# Patient Record
Sex: Female | Born: 1980 | Race: White | Hispanic: No | Marital: Married | Smoking: Never smoker
Health system: Southern US, Community
[De-identification: ages and names within clinical notes are randomized; demographics above are authoritative.]

## PROBLEM LIST (undated history)

## (undated) ENCOUNTER — Inpatient Hospital Stay (HOSPITAL_COMMUNITY): Payer: Self-pay

## (undated) DIAGNOSIS — D649 Anemia, unspecified: Secondary | ICD-10-CM

## (undated) HISTORY — PX: NASAL RECONSTRUCTION WITH SEPTAL REPAIR: SHX5665

## (undated) HISTORY — PX: WISDOM TOOTH EXTRACTION: SHX21

---

## 2012-10-12 ENCOUNTER — Other Ambulatory Visit: Payer: Self-pay | Admitting: Obstetrics & Gynecology

## 2012-10-12 DIAGNOSIS — N644 Mastodynia: Secondary | ICD-10-CM

## 2012-10-12 DIAGNOSIS — N63 Unspecified lump in unspecified breast: Secondary | ICD-10-CM

## 2012-10-20 ENCOUNTER — Ambulatory Visit
Admission: RE | Admit: 2012-10-20 | Discharge: 2012-10-20 | Disposition: A | Discharge status: Home / Self Care | Payer: BC Managed Care – PPO | Source: Ambulatory Visit | Attending: Obstetrics & Gynecology | Admitting: Obstetrics & Gynecology

## 2012-10-20 DIAGNOSIS — N644 Mastodynia: Secondary | ICD-10-CM

## 2012-10-20 DIAGNOSIS — N63 Unspecified lump in unspecified breast: Secondary | ICD-10-CM

## 2016-07-02 DIAGNOSIS — Z8249 Family history of ischemic heart disease and other diseases of the circulatory system: Secondary | ICD-10-CM | POA: Diagnosis not present

## 2016-07-02 DIAGNOSIS — Z6821 Body mass index (BMI) 21.0-21.9, adult: Secondary | ICD-10-CM | POA: Diagnosis not present

## 2016-07-02 DIAGNOSIS — Z0000 Encounter for general adult medical examination without abnormal findings: Secondary | ICD-10-CM | POA: Diagnosis not present

## 2016-07-24 DIAGNOSIS — Z8249 Family history of ischemic heart disease and other diseases of the circulatory system: Secondary | ICD-10-CM | POA: Diagnosis not present

## 2016-07-24 DIAGNOSIS — Z136 Encounter for screening for cardiovascular disorders: Secondary | ICD-10-CM | POA: Diagnosis not present

## 2016-08-11 DIAGNOSIS — Z3201 Encounter for pregnancy test, result positive: Secondary | ICD-10-CM | POA: Diagnosis not present

## 2016-08-13 DIAGNOSIS — Z8249 Family history of ischemic heart disease and other diseases of the circulatory system: Secondary | ICD-10-CM | POA: Diagnosis not present

## 2016-08-27 DIAGNOSIS — Z3201 Encounter for pregnancy test, result positive: Secondary | ICD-10-CM | POA: Diagnosis not present

## 2016-09-16 DIAGNOSIS — Z3689 Encounter for other specified antenatal screening: Secondary | ICD-10-CM | POA: Diagnosis not present

## 2016-09-16 DIAGNOSIS — Z113 Encounter for screening for infections with a predominantly sexual mode of transmission: Secondary | ICD-10-CM | POA: Diagnosis not present

## 2016-09-16 DIAGNOSIS — O09511 Supervision of elderly primigravida, first trimester: Secondary | ICD-10-CM | POA: Diagnosis not present

## 2016-09-16 LAB — OB RESULTS CONSOLE RPR: RPR: NONREACTIVE

## 2016-09-16 LAB — OB RESULTS CONSOLE HEPATITIS B SURFACE ANTIGEN: Hepatitis B Surface Ag: NEGATIVE

## 2016-09-16 LAB — OB RESULTS CONSOLE HIV ANTIBODY (ROUTINE TESTING): HIV: NONREACTIVE

## 2016-09-16 LAB — OB RESULTS CONSOLE RUBELLA ANTIBODY, IGM: Rubella: NON-IMMUNE/NOT IMMUNE

## 2016-09-17 DIAGNOSIS — R0600 Dyspnea, unspecified: Secondary | ICD-10-CM | POA: Diagnosis not present

## 2016-09-17 DIAGNOSIS — Z0389 Encounter for observation for other suspected diseases and conditions ruled out: Secondary | ICD-10-CM | POA: Diagnosis not present

## 2016-09-17 DIAGNOSIS — R002 Palpitations: Secondary | ICD-10-CM | POA: Diagnosis not present

## 2016-09-28 NOTE — L&D Delivery Note (Signed)
Delivery Note At 11:32 AM a viable and healthy female was delivered via Vaginal, Spontaneous Delivery (Presentation: LOA  ).  APGAR: 9, 9; weight  pending.   Placenta status spontaneous ,intact .  Cord:  with the following complications: none .  Cord pH: na  Anesthesia:epidural   Episiotomy: None Lacerations: 2nd degree Suture Repair: 2.0 3.0 vicryl rapide Est. Blood Loss (mL): 300  Mom to postpartum.  Baby to Couplet care / Skin to Skin.  Lundon Verdejo J 04/19/2017, 12:24 PM

## 2016-10-05 DIAGNOSIS — Z23 Encounter for immunization: Secondary | ICD-10-CM | POA: Diagnosis not present

## 2016-10-05 DIAGNOSIS — Z3491 Encounter for supervision of normal pregnancy, unspecified, first trimester: Secondary | ICD-10-CM | POA: Diagnosis not present

## 2016-10-05 DIAGNOSIS — Z3682 Encounter for antenatal screening for nuchal translucency: Secondary | ICD-10-CM | POA: Diagnosis not present

## 2016-10-05 DIAGNOSIS — Z36 Encounter for antenatal screening for chromosomal anomalies: Secondary | ICD-10-CM | POA: Diagnosis not present

## 2016-10-30 DIAGNOSIS — Z361 Encounter for antenatal screening for raised alphafetoprotein level: Secondary | ICD-10-CM | POA: Diagnosis not present

## 2016-11-20 DIAGNOSIS — Z363 Encounter for antenatal screening for malformations: Secondary | ICD-10-CM | POA: Diagnosis not present

## 2016-12-04 DIAGNOSIS — H35413 Lattice degeneration of retina, bilateral: Secondary | ICD-10-CM | POA: Diagnosis not present

## 2016-12-29 DIAGNOSIS — O43192 Other malformation of placenta, second trimester: Secondary | ICD-10-CM | POA: Diagnosis not present

## 2016-12-29 DIAGNOSIS — O09512 Supervision of elderly primigravida, second trimester: Secondary | ICD-10-CM | POA: Diagnosis not present

## 2016-12-29 DIAGNOSIS — Z3A24 24 weeks gestation of pregnancy: Secondary | ICD-10-CM | POA: Diagnosis not present

## 2017-01-25 DIAGNOSIS — Z23 Encounter for immunization: Secondary | ICD-10-CM | POA: Diagnosis not present

## 2017-01-25 DIAGNOSIS — O09513 Supervision of elderly primigravida, third trimester: Secondary | ICD-10-CM | POA: Diagnosis not present

## 2017-01-25 DIAGNOSIS — O43193 Other malformation of placenta, third trimester: Secondary | ICD-10-CM | POA: Diagnosis not present

## 2017-01-25 DIAGNOSIS — Z3689 Encounter for other specified antenatal screening: Secondary | ICD-10-CM | POA: Diagnosis not present

## 2017-01-25 DIAGNOSIS — Z3A28 28 weeks gestation of pregnancy: Secondary | ICD-10-CM | POA: Diagnosis not present

## 2017-02-08 DIAGNOSIS — O09513 Supervision of elderly primigravida, third trimester: Secondary | ICD-10-CM | POA: Diagnosis not present

## 2017-02-08 DIAGNOSIS — Z3A30 30 weeks gestation of pregnancy: Secondary | ICD-10-CM | POA: Diagnosis not present

## 2017-02-23 DIAGNOSIS — Z3A32 32 weeks gestation of pregnancy: Secondary | ICD-10-CM | POA: Diagnosis not present

## 2017-02-23 DIAGNOSIS — O09513 Supervision of elderly primigravida, third trimester: Secondary | ICD-10-CM | POA: Diagnosis not present

## 2017-03-03 ENCOUNTER — Inpatient Hospital Stay (HOSPITAL_COMMUNITY): Payer: BLUE CROSS/BLUE SHIELD

## 2017-03-03 ENCOUNTER — Inpatient Hospital Stay (HOSPITAL_COMMUNITY)
Admission: AD | Admit: 2017-03-03 | Discharge: 2017-03-03 | Disposition: A | Discharge status: Home / Self Care | Payer: BLUE CROSS/BLUE SHIELD | Source: Ambulatory Visit | Attending: Obstetrics and Gynecology | Admitting: Obstetrics and Gynecology

## 2017-03-03 ENCOUNTER — Encounter (HOSPITAL_COMMUNITY): Payer: Self-pay | Admitting: *Deleted

## 2017-03-03 DIAGNOSIS — O9A213 Injury, poisoning and certain other consequences of external causes complicating pregnancy, third trimester: Secondary | ICD-10-CM

## 2017-03-03 DIAGNOSIS — Z833 Family history of diabetes mellitus: Secondary | ICD-10-CM | POA: Diagnosis not present

## 2017-03-03 DIAGNOSIS — R109 Unspecified abdominal pain: Secondary | ICD-10-CM | POA: Insufficient documentation

## 2017-03-03 DIAGNOSIS — Z041 Encounter for examination and observation following transport accident: Secondary | ICD-10-CM | POA: Diagnosis not present

## 2017-03-03 DIAGNOSIS — Z8249 Family history of ischemic heart disease and other diseases of the circulatory system: Secondary | ICD-10-CM | POA: Diagnosis not present

## 2017-03-03 DIAGNOSIS — O09513 Supervision of elderly primigravida, third trimester: Secondary | ICD-10-CM | POA: Diagnosis not present

## 2017-03-03 DIAGNOSIS — Z3A33 33 weeks gestation of pregnancy: Secondary | ICD-10-CM

## 2017-03-03 DIAGNOSIS — O26893 Other specified pregnancy related conditions, third trimester: Secondary | ICD-10-CM | POA: Insufficient documentation

## 2017-03-03 HISTORY — DX: Anemia, unspecified: D64.9

## 2017-03-03 NOTE — MAU Note (Signed)
Urine in lab 
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
## 2017-03-09 DIAGNOSIS — O43193 Other malformation of placenta, third trimester: Secondary | ICD-10-CM | POA: Diagnosis not present

## 2017-03-09 DIAGNOSIS — Z3A34 34 weeks gestation of pregnancy: Secondary | ICD-10-CM | POA: Diagnosis not present

## 2017-03-22 DIAGNOSIS — Z3A36 36 weeks gestation of pregnancy: Secondary | ICD-10-CM | POA: Diagnosis not present

## 2017-03-22 DIAGNOSIS — Z3685 Encounter for antenatal screening for Streptococcus B: Secondary | ICD-10-CM | POA: Diagnosis not present

## 2017-03-22 DIAGNOSIS — O43199 Other malformation of placenta, unspecified trimester: Secondary | ICD-10-CM | POA: Diagnosis not present

## 2017-03-22 DIAGNOSIS — O43193 Other malformation of placenta, third trimester: Secondary | ICD-10-CM | POA: Diagnosis not present

## 2017-03-22 DIAGNOSIS — O09513 Supervision of elderly primigravida, third trimester: Secondary | ICD-10-CM | POA: Diagnosis not present

## 2017-03-22 LAB — OB RESULTS CONSOLE GBS: GBS: NEGATIVE

## 2017-03-26 DIAGNOSIS — Z3482 Encounter for supervision of other normal pregnancy, second trimester: Secondary | ICD-10-CM | POA: Diagnosis not present

## 2017-03-26 DIAGNOSIS — Z3483 Encounter for supervision of other normal pregnancy, third trimester: Secondary | ICD-10-CM | POA: Diagnosis not present

## 2017-03-29 DIAGNOSIS — O09513 Supervision of elderly primigravida, third trimester: Secondary | ICD-10-CM | POA: Diagnosis not present

## 2017-03-29 DIAGNOSIS — Z3A37 37 weeks gestation of pregnancy: Secondary | ICD-10-CM | POA: Diagnosis not present

## 2017-04-06 DIAGNOSIS — Z3A38 38 weeks gestation of pregnancy: Secondary | ICD-10-CM | POA: Diagnosis not present

## 2017-04-06 DIAGNOSIS — O09513 Supervision of elderly primigravida, third trimester: Secondary | ICD-10-CM | POA: Diagnosis not present

## 2017-04-12 DIAGNOSIS — O09513 Supervision of elderly primigravida, third trimester: Secondary | ICD-10-CM | POA: Diagnosis not present

## 2017-04-12 DIAGNOSIS — Z3A39 39 weeks gestation of pregnancy: Secondary | ICD-10-CM | POA: Diagnosis not present

## 2017-04-12 DIAGNOSIS — O43103 Malformation of placenta, unspecified, third trimester: Secondary | ICD-10-CM | POA: Diagnosis not present

## 2017-04-18 ENCOUNTER — Inpatient Hospital Stay (HOSPITAL_COMMUNITY)
Admission: AD | Admit: 2017-04-18 | Discharge: 2017-04-18 | Disposition: A | Discharge status: Home / Self Care | Payer: BLUE CROSS/BLUE SHIELD | Source: Ambulatory Visit | Attending: Obstetrics and Gynecology | Admitting: Obstetrics and Gynecology

## 2017-04-18 ENCOUNTER — Encounter (HOSPITAL_COMMUNITY): Payer: Self-pay | Admitting: Certified Nurse Midwife

## 2017-04-18 DIAGNOSIS — Z3A40 40 weeks gestation of pregnancy: Secondary | ICD-10-CM | POA: Diagnosis not present

## 2017-04-18 DIAGNOSIS — D62 Acute posthemorrhagic anemia: Secondary | ICD-10-CM | POA: Diagnosis not present

## 2017-04-18 DIAGNOSIS — O9081 Anemia of the puerperium: Secondary | ICD-10-CM | POA: Diagnosis not present

## 2017-04-18 DIAGNOSIS — Z23 Encounter for immunization: Secondary | ICD-10-CM | POA: Diagnosis not present

## 2017-04-18 DIAGNOSIS — Z412 Encounter for routine and ritual male circumcision: Secondary | ICD-10-CM | POA: Diagnosis not present

## 2017-04-18 DIAGNOSIS — Z3493 Encounter for supervision of normal pregnancy, unspecified, third trimester: Secondary | ICD-10-CM | POA: Diagnosis not present

## 2017-04-18 DIAGNOSIS — O479 False labor, unspecified: Secondary | ICD-10-CM

## 2017-04-18 NOTE — Discharge Instructions (Signed)

## 2017-04-18 NOTE — MAU Note (Signed)
I have communicated with Dr Ronita Hipps and reviewed vital signs:  Vitals:   04/18/17 2140 04/18/17 2324  BP: 132/90 125/79  Pulse: (!) 117 91  Resp: 20 16  Temp: 98.6 F (37 C) 98.4 F (36.9 C)    Vaginal exam:  Dilation: 1.5 Effacement (%): 90 Cervical Position: Middle Station: -2 Presentation: Vertex Exam by:: Wende Bushy RN ,   Also reviewed contraction pattern and that non-stress test is reactive.  It has been documented that patient is contracting every 3-5 minutes with minimal cervical change over 1.5 hours not indicating active labor.  Patient denies any other complaints.  Based on this report provider has given order for discharge.  A discharge order and diagnosis entered by a provider.   Labor discharge instructions reviewed with patient.

## 2017-04-18 NOTE — MAU Note (Signed)
Pt reports contractions every 5-6 mins since 7pm. Pt denies LOF or vaginal bleeding. Reports good fetal movement.

## 2017-04-18 NOTE — MAU Note (Signed)
Urine in lab 

## 2017-04-19 ENCOUNTER — Inpatient Hospital Stay (HOSPITAL_COMMUNITY)
Admission: AD | Admit: 2017-04-19 | Discharge: 2017-04-21 | DRG: 775 | Disposition: A | Discharge status: Home / Self Care | Payer: BLUE CROSS/BLUE SHIELD | Source: Ambulatory Visit | Attending: Obstetrics and Gynecology | Admitting: Obstetrics and Gynecology

## 2017-04-19 ENCOUNTER — Inpatient Hospital Stay (HOSPITAL_COMMUNITY): Payer: BLUE CROSS/BLUE SHIELD | Admitting: Anesthesiology

## 2017-04-19 ENCOUNTER — Encounter (HOSPITAL_COMMUNITY): Payer: Self-pay | Admitting: Certified Nurse Midwife

## 2017-04-19 DIAGNOSIS — O9081 Anemia of the puerperium: Principal | ICD-10-CM | POA: Diagnosis not present

## 2017-04-19 DIAGNOSIS — D62 Acute posthemorrhagic anemia: Secondary | ICD-10-CM | POA: Diagnosis not present

## 2017-04-19 DIAGNOSIS — Z412 Encounter for routine and ritual male circumcision: Secondary | ICD-10-CM | POA: Diagnosis not present

## 2017-04-19 DIAGNOSIS — Z3A40 40 weeks gestation of pregnancy: Secondary | ICD-10-CM

## 2017-04-19 DIAGNOSIS — Z3493 Encounter for supervision of normal pregnancy, unspecified, third trimester: Secondary | ICD-10-CM | POA: Diagnosis present

## 2017-04-19 LAB — TYPE AND SCREEN
ABO/RH(D): O POS
Antibody Screen: NEGATIVE

## 2017-04-19 LAB — CBC
HCT: 35.9 % — ABNORMAL LOW (ref 36.0–46.0)
Hemoglobin: 12.3 g/dL (ref 12.0–15.0)
MCH: 28.5 pg (ref 26.0–34.0)
MCHC: 34.3 g/dL (ref 30.0–36.0)
MCV: 83.3 fL (ref 78.0–100.0)
Platelets: 245 10*3/uL (ref 150–400)
RBC: 4.31 MIL/uL (ref 3.87–5.11)
RDW: 13.9 % (ref 11.5–15.5)
WBC: 14.2 10*3/uL — ABNORMAL HIGH (ref 4.0–10.5)

## 2017-04-19 LAB — ABO/RH: ABO/RH(D): O POS

## 2017-04-19 MED ORDER — DIPHENHYDRAMINE HCL 50 MG/ML IJ SOLN: 12.5000 mg | INTRAMUSCULAR | Status: DC | PRN

## 2017-04-19 MED ORDER — EPHEDRINE 5 MG/ML INJ
10.0000 mg | INTRAVENOUS | Status: DC | PRN
  Filled 2017-04-19: qty 2

## 2017-04-19 MED ORDER — BENZOCAINE-MENTHOL 20-0.5 % EX AERO
1.0000 "application " | INHALATION_SPRAY | CUTANEOUS | Status: DC | PRN
  Filled 2017-04-19: qty 56

## 2017-04-19 MED ORDER — COCONUT OIL OIL: 1.0000 "application " | TOPICAL_OIL | Status: DC | PRN

## 2017-04-19 MED ORDER — PHENYLEPHRINE 40 MCG/ML (10ML) SYRINGE FOR IV PUSH (FOR BLOOD PRESSURE SUPPORT): 80.0000 ug | PREFILLED_SYRINGE | INTRAVENOUS | Status: DC | PRN

## 2017-04-19 MED ORDER — ONDANSETRON HCL 4 MG PO TABS: 4.0000 mg | ORAL_TABLET | ORAL | Status: DC | PRN

## 2017-04-19 MED ORDER — SOD CITRATE-CITRIC ACID 500-334 MG/5ML PO SOLN: 30.0000 mL | ORAL | Status: DC | PRN

## 2017-04-19 MED ORDER — LIDOCAINE HCL (PF) 1 % IJ SOLN
INTRAMUSCULAR | Status: DC | PRN
  Administered 2017-04-19 (×2): 5 mL via EPIDURAL

## 2017-04-19 MED ORDER — PRENATAL MULTIVITAMIN CH
1.0000 | ORAL_TABLET | Freq: Every day | ORAL | Status: DC
  Administered 2017-04-20: 1 via ORAL
  Filled 2017-04-19: qty 1

## 2017-04-19 MED ORDER — OXYCODONE-ACETAMINOPHEN 5-325 MG PO TABS: 1.0000 | ORAL_TABLET | ORAL | Status: DC | PRN

## 2017-04-19 MED ORDER — FENTANYL 2.5 MCG/ML BUPIVACAINE 1/10 % EPIDURAL INFUSION (WH - ANES): 14.0000 mL/h | INTRAMUSCULAR | Status: DC | PRN

## 2017-04-19 MED ORDER — IBUPROFEN 600 MG PO TABS
600.0000 mg | ORAL_TABLET | Freq: Four times a day (QID) | ORAL | Status: DC
  Administered 2017-04-19 – 2017-04-20 (×6): 600 mg via ORAL
  Filled 2017-04-19 (×7): qty 1

## 2017-04-19 MED ORDER — EPHEDRINE 5 MG/ML INJ: 10.0000 mg | INTRAVENOUS | Status: DC | PRN

## 2017-04-19 MED ORDER — OXYCODONE-ACETAMINOPHEN 5-325 MG PO TABS: 2.0000 | ORAL_TABLET | ORAL | Status: DC | PRN

## 2017-04-19 MED ORDER — LACTATED RINGERS IV SOLN
500.0000 mL | INTRAVENOUS | Status: DC | PRN
  Administered 2017-04-19: 500 mL via INTRAVENOUS

## 2017-04-19 MED ORDER — DIBUCAINE 1 % RE OINT: 1.0000 "application " | TOPICAL_OINTMENT | RECTAL | Status: DC | PRN

## 2017-04-19 MED ORDER — LIDOCAINE HCL (PF) 1 % IJ SOLN
30.0000 mL | INTRAMUSCULAR | Status: DC | PRN
  Filled 2017-04-19: qty 30

## 2017-04-19 MED ORDER — OXYTOCIN 40 UNITS IN LACTATED RINGERS INFUSION - SIMPLE MED
2.5000 [IU]/h | INTRAVENOUS | Status: DC
  Administered 2017-04-19: 2.5 [IU]/h via INTRAVENOUS
  Filled 2017-04-19: qty 1000

## 2017-04-19 MED ORDER — LACTATED RINGERS IV SOLN: 500.0000 mL | Freq: Once | INTRAVENOUS | Status: DC

## 2017-04-19 MED ORDER — DIPHENHYDRAMINE HCL 25 MG PO CAPS: 25.0000 mg | ORAL_CAPSULE | Freq: Four times a day (QID) | ORAL | Status: DC | PRN

## 2017-04-19 MED ORDER — PHENYLEPHRINE 40 MCG/ML (10ML) SYRINGE FOR IV PUSH (FOR BLOOD PRESSURE SUPPORT)
80.0000 ug | PREFILLED_SYRINGE | INTRAVENOUS | Status: DC | PRN
  Filled 2017-04-19: qty 5

## 2017-04-19 MED ORDER — ONDANSETRON HCL 4 MG/2ML IJ SOLN: 4.0000 mg | Freq: Four times a day (QID) | INTRAMUSCULAR | Status: DC | PRN

## 2017-04-19 MED ORDER — LACTATED RINGERS IV SOLN: INTRAVENOUS | Status: DC

## 2017-04-19 MED ORDER — ACETAMINOPHEN 325 MG PO TABS
650.0000 mg | ORAL_TABLET | ORAL | Status: DC | PRN
Start: 2017-04-19 — End: 2017-04-21
  Administered 2017-04-20: 650 mg via ORAL
  Filled 2017-04-19: qty 2

## 2017-04-19 MED ORDER — SENNOSIDES-DOCUSATE SODIUM 8.6-50 MG PO TABS
2.0000 | ORAL_TABLET | ORAL | Status: DC
  Administered 2017-04-19 – 2017-04-20 (×2): 2 via ORAL
  Filled 2017-04-19 (×2): qty 2

## 2017-04-19 MED ORDER — SIMETHICONE 80 MG PO CHEW: 80.0000 mg | CHEWABLE_TABLET | ORAL | Status: DC | PRN

## 2017-04-19 MED ORDER — FLEET ENEMA 7-19 GM/118ML RE ENEM: 1.0000 | ENEMA | RECTAL | Status: DC | PRN

## 2017-04-19 MED ORDER — ONDANSETRON HCL 4 MG/2ML IJ SOLN: 4.0000 mg | INTRAMUSCULAR | Status: DC | PRN

## 2017-04-19 MED ORDER — ACETAMINOPHEN 325 MG PO TABS: 650.0000 mg | ORAL_TABLET | ORAL | Status: DC | PRN

## 2017-04-19 MED ORDER — PHENYLEPHRINE 40 MCG/ML (10ML) SYRINGE FOR IV PUSH (FOR BLOOD PRESSURE SUPPORT)
80.0000 ug | PREFILLED_SYRINGE | INTRAVENOUS | Status: DC | PRN
  Filled 2017-04-19: qty 10
  Filled 2017-04-19: qty 5

## 2017-04-19 MED ORDER — FENTANYL 2.5 MCG/ML BUPIVACAINE 1/10 % EPIDURAL INFUSION (WH - ANES)
14.0000 mL/h | INTRAMUSCULAR | Status: DC | PRN
  Administered 2017-04-19 (×2): 14 mL/h via EPIDURAL
  Filled 2017-04-19 (×2): qty 100

## 2017-04-19 MED ORDER — TETANUS-DIPHTH-ACELL PERTUSSIS 5-2.5-18.5 LF-MCG/0.5 IM SUSP
0.5000 mL | Freq: Once | INTRAMUSCULAR | Status: AC
  Administered 2017-04-20: 0.5 mL via INTRAMUSCULAR
  Filled 2017-04-19: qty 0.5

## 2017-04-19 MED ORDER — WITCH HAZEL-GLYCERIN EX PADS: 1.0000 "application " | MEDICATED_PAD | CUTANEOUS | Status: DC | PRN

## 2017-04-19 MED ORDER — LACTATED RINGERS IV SOLN
500.0000 mL | Freq: Once | INTRAVENOUS | Status: AC
  Administered 2017-04-19: 500 mL via INTRAVENOUS

## 2017-04-19 MED ORDER — METHYLERGONOVINE MALEATE 0.2 MG/ML IJ SOLN
0.2000 mg | INTRAMUSCULAR | Status: DC | PRN
Start: 2017-04-19 — End: 2017-04-21

## 2017-04-19 MED ORDER — METHYLERGONOVINE MALEATE 0.2 MG PO TABS
0.2000 mg | ORAL_TABLET | ORAL | Status: DC | PRN
Start: 2017-04-19 — End: 2017-04-21

## 2017-04-19 MED ORDER — ZOLPIDEM TARTRATE 5 MG PO TABS: 5.0000 mg | ORAL_TABLET | Freq: Every evening | ORAL | Status: DC | PRN

## 2017-04-19 MED ORDER — OXYTOCIN BOLUS FROM INFUSION
500.0000 mL | Freq: Once | INTRAVENOUS | Status: AC
  Administered 2017-04-19: 500 mL via INTRAVENOUS

## 2017-04-19 NOTE — H&P (Signed)
Jade Beck is a 36 y.o. female presenting for contractions. OB History    Gravida Para Term Preterm AB Living   1             SAB TAB Ectopic Multiple Live Births                 Past Medical History:  Diagnosis Date  . Anemia    pregnancy induced   Past Surgical History:  Procedure Laterality Date  . NASAL RECONSTRUCTION WITH SEPTAL REPAIR    . WISDOM TOOTH EXTRACTION     Family History: family history includes Diabetes in her father; Heart disease in her brother. Social History:  reports that she has never smoked. She has never used smokeless tobacco. She reports that she does not drink alcohol or use drugs.     Maternal Diabetes: No Genetic Screening: Normal Maternal Ultrasounds/Referrals: Normal Fetal Ultrasounds or other Referrals:  None Maternal Substance Abuse:  No Significant Maternal Medications:  None Significant Maternal Lab Results:  None Other Comments:  None  Review of Systems  Constitutional: Negative.   All other systems reviewed and are negative.  Maternal Medical History:  Reason for admission: Contractions.   Contractions: Onset was 3-5 hours ago.   Frequency: irregular.   Perceived severity is moderate.    Fetal activity: Perceived fetal activity is normal.   Last perceived fetal movement was within the past hour.    Prenatal complications: Placental abnormality.   Prenatal Complications - Diabetes: none.    Dilation: 5 Effacement (%): 90 Station: 0, +1 Exam by:: Lamonte Sakai, RNC Blood pressure (!) 101/43, pulse 73, temperature 98.1 F (36.7 C), temperature source Oral, resp. rate 18, height 5\' 9"  (1.753 m), weight 84.8 kg (187 lb), SpO2 100 %. Maternal Exam:  Uterine Assessment: Contraction strength is moderate.  Contraction frequency is irregular.   Abdomen: Patient reports no abdominal tenderness. Fetal presentation: vertex  Introitus: Normal vulva. Normal vagina.  Ferning test: not done.  Nitrazine test: not done. Amniotic fluid  character: not assessed.  Pelvis: adequate for delivery.   Cervix: Cervix evaluated by digital exam.     Physical Exam  Nursing note and vitals reviewed. Constitutional: She is oriented to person, place, and time. She appears well-developed and well-nourished.  HENT:  Head: Normocephalic and atraumatic.  Neck: Normal range of motion.  Cardiovascular: Normal rate and regular rhythm.   Respiratory: Effort normal and breath sounds normal.  GI: Bowel sounds are normal.  Genitourinary: Vagina normal and uterus normal.  Neurological: She is alert and oriented to person, place, and time. She has normal reflexes.  Skin: Skin is warm and dry.  Psychiatric: She has a normal mood and affect.    Prenatal labs: ABO, Rh: --/--/O POS, O POS (07/23 0151) Antibody: NEG (07/23 0151) Rubella:   RPR:    HBsAg:    HIV:    GBS:     Assessment/Plan: 40w IUP Early labor Marginal PCI Admit   Pharrah Rottman J 04/19/2017, 6:06 AM

## 2017-04-19 NOTE — Anesthesia Pain Management Evaluation Note (Signed)
  CRNA Pain Management Visit Note  Patient: Jade Beck, 36 y.o., female  "Hello I am a member of the anesthesia team at [facility]. We have an anesthesia team available at all times to provide care throughout the hospital, including epidural management and anesthesia for C-section. I don't know your plan for the delivery whether it a natural birth, water birth, IV sedation, nitrous supplementation, doula or epidural, but we want to meet your pain goals."   1.Was your pain managed to your expectations on prior hospitalizations?   Yes   2.What is your expectation for pain management during this hospitalization?     Epidural  3.How can we help you reach that goal? Pt comfortable with epidural  Record the patient's initial score and the patient's pain goal.   Pain: 0  Pain Goal: 5 [facility] wants you to be able to say your pain was always managed very well.  Brittinee Risk 04/19/2017

## 2017-04-19 NOTE — Anesthesia Preprocedure Evaluation (Signed)

## 2017-04-19 NOTE — Anesthesia Procedure Notes (Signed)
Epidural Patient location during procedure: OB Start time: 04/19/2017 2:50 AM End time: 04/19/2017 2:59 AM  Staffing Anesthesiologist: Lionel Woodberry  Preanesthetic Checklist Completed: patient identified, site marked, surgical consent, pre-op evaluation, timeout performed, IV checked, risks and benefits discussed and monitors and equipment checked  Epidural Patient position: sitting Prep: site prepped and draped and DuraPrep Patient monitoring: continuous pulse ox and blood pressure Approach: midline Location: L4-L5 Injection technique: LOR air  Needle:  Needle type: Tuohy  Needle gauge: 17 G Needle length: 9 cm and 9 Needle insertion depth: 5 cm cm Catheter type: closed end flexible Catheter size: 19 Gauge Catheter at skin depth: 10 cm Test dose: negative  Assessment Events: blood not aspirated, injection not painful, no injection resistance, negative IV test and no paresthesia

## 2017-04-19 NOTE — MAU Note (Signed)
Patient has returned to MAU for c/o stronger contractions. Patient 1.5cm on discharge at 2300.

## 2017-04-20 LAB — RPR: RPR Ser Ql: NONREACTIVE

## 2017-04-20 LAB — CBC
HCT: 26.6 % — ABNORMAL LOW (ref 36.0–46.0)
Hemoglobin: 9 g/dL — ABNORMAL LOW (ref 12.0–15.0)
MCH: 29.1 pg (ref 26.0–34.0)
MCHC: 33.8 g/dL (ref 30.0–36.0)
MCV: 86.1 fL (ref 78.0–100.0)
Platelets: 168 10*3/uL (ref 150–400)
RBC: 3.09 MIL/uL — ABNORMAL LOW (ref 3.87–5.11)
RDW: 14.5 % (ref 11.5–15.5)
WBC: 15.8 10*3/uL — ABNORMAL HIGH (ref 4.0–10.5)

## 2017-04-20 MED ORDER — MAGNESIUM OXIDE 400 (241.3 MG) MG PO TABS
400.0000 mg | ORAL_TABLET | Freq: Every day | ORAL | Status: DC
  Administered 2017-04-20: 400 mg via ORAL
  Filled 2017-04-20 (×2): qty 1

## 2017-04-20 MED ORDER — FERROUS SULFATE 325 (65 FE) MG PO TABS
325.0000 mg | ORAL_TABLET | Freq: Two times a day (BID) | ORAL | Status: DC
  Administered 2017-04-20: 325 mg via ORAL
  Filled 2017-04-20: qty 1

## 2017-04-20 MED ORDER — MEASLES, MUMPS & RUBELLA VAC ~~LOC~~ INJ
0.5000 mL | INJECTION | Freq: Once | SUBCUTANEOUS | Status: AC
  Administered 2017-04-20: 0.5 mL via SUBCUTANEOUS
  Filled 2017-04-20: qty 0.5

## 2017-04-20 NOTE — Lactation Note (Signed)
This note was copied from a baby's chart. Lactation Consultation Note  Patient Name: Jade Beck WFUXN'A Date: 04/20/2017 Reason for consult: Initial assessment Baby at 24 hr of life. Mom is concerned that baby is not "taking the whole nipple and he is using me as a pacifier". Discussed baby behavior, feeding frequency, positioning at the breast, baby belly size, voids, wt loss, breast changes, and nipple care. Demonstrated manual expression, colostrum noted bilaterally, spoon in room. Given lactation handouts. Aware of OP services and support group. Mom will offer the breast on demand 8+/24hr, post express, and spoon feed as needed. Lactation phone number is on the white board for her to call at the next feeding.     Maternal Data Has patient been taught Hand Expression?: Yes Does the patient have breastfeeding experience prior to this delivery?: No  Feeding Feeding Type: Breast Fed Length of feed: 30 min  LATCH Score/Interventions Latch: Too sleepy or reluctant, no latch achieved, no sucking elicited.                    Lactation Tools Discussed/Used WIC Program: No   Consult Status Consult Status: Follow-up Date: 04/21/17 Follow-up type: In-patient    Denzil Hughes 04/20/2017, 11:33 AM

## 2017-04-20 NOTE — Progress Notes (Signed)
PPD #1, SVD, 2nd degree perineal laceration, baby boy  S:  Reports feeling good, slept well last night   Some discomfort from repair with prolonged sitting or standing             Tolerating po/ No nausea or vomiting / Denies SOB / Had one episode of dizziness after delivery, but has resolved             Bleeding is moderate             Pain controlled with Motrin             Up ad lib / ambulatory / voiding QS  Newborn breast feeding - getting more comfortable with latch/positioning  / Circumcision - planning today by Dr. Ronita Hipps  O:               VS: BP (!) 100/47 (BP Location: Right Arm)   Pulse 93   Temp 98.7 F (37.1 C) (Oral)   Resp 16   Ht 5' 9"  (1.753 m)   Wt 84.8 kg (187 lb)   SpO2 100%   Breastfeeding? Unknown   BMI 27.62 kg/m    LABS:              Recent Labs  04/19/17 0151 04/20/17 0606  WBC 14.2* 15.8*  HGB 12.3 9.0*  PLT 245 168               Blood type: --/--/O POS, O POS (07/23 0151)  Rubella: Nonimmune (12/20 0000)                     I&O: Intake/Output      07/23 0701 - 07/24 0700 07/24 0701 - 07/25 0700   Urine (mL/kg/hr) 1400 (0.7)    Blood 300    Total Output 1700     Net -1700          Urine Occurrence 1 x                  Physical Exam:             Alert and oriented X3  Lungs: Clear and unlabored  Heart: regular rate and rhythm / no mumurs  Abdomen: soft, non-tender, non-distended              Fundus: firm, non-tender, U-1  Perineum: well approximated 2nd degree perineal laceration, no significant edema, no erythema, no evidence of hematoma  Lochia: appropriate, no clots  Extremities: no edema, no calf pain or tenderness    A: PPD # 1, SVD  2nd degree laceration  ABL anemia compounding maternal IDA  Rubella Non-immune   Doing well - stable status  P: Routine post partum orders  Pt. Requested to receive MMR and Tdap vaccine today - given by RN  Niferex 163m BID  Magnesium Oxide 4038mdaily  See lactation today  May ambulate in  halls  May shower today  Anticipate d/c home tomorrow   MeLars PinksMSN, CNM [facility]

## 2017-04-21 DIAGNOSIS — D62 Acute posthemorrhagic anemia: Secondary | ICD-10-CM | POA: Diagnosis not present

## 2017-04-21 MED ORDER — MAGNESIUM OXIDE 400 (241.3 MG) MG PO TABS: 400.0000 mg | ORAL_TABLET | Freq: Every day | ORAL | 0 refills | Status: DC

## 2017-04-21 MED ORDER — IBUPROFEN 600 MG PO TABS: 600.0000 mg | ORAL_TABLET | Freq: Four times a day (QID) | ORAL | 0 refills | Status: DC

## 2017-04-21 MED ORDER — FERROUS SULFATE 325 (65 FE) MG PO TABS: 325.0000 mg | ORAL_TABLET | Freq: Every day | ORAL | 0 refills | Status: DC

## 2017-04-21 NOTE — Lactation Note (Addendum)
This note was copied from a baby's chart. Lactation Consultation Note  Patient Name: Jade Beck JSEGB'T Date: 04/21/2017 Reason for consult: Follow-up assessment  Baby 57 hours old. Parents report concern that baby is not getting enough at the breast, has had low output (voids) and mom really wants to BF. Assisted mom to latch baby in football position--mom was using cradle position. Baby able to latch deeply and suckle rhythmically with a few swallows noted. Assisted mom with hand expression, and parents able to spoon-feed about 3 ml of EBM. Baby tolerated well, and mom's EBM volume increasing with hand expression. Mom given a manual pump with review, and a curve tipped syringe to supplement larger volumes as needed. Mom reports that she has a DEBP at home as well.  Enc mom to put baby to breast with cues, then supplement with EBM/formula according to guidelines--which were given with review. Discussed engorgement prevention/treatment. Mom aware of OP/BFSG and [place] phone line assistance after D/C.      Maternal Data    Feeding Feeding Type: Breast Fed Length of feed: 10 min  LATCH Score/Interventions Latch: Grasps breast easily, tongue down, lips flanged, rhythmical sucking. Intervention(s): Skin to skin;Teach feeding cues;Waking techniques Intervention(s): Adjust position;Assist with latch;Breast compression  Audible Swallowing: A few with stimulation Intervention(s): Skin to skin;Hand expression  Type of Nipple: Everted at rest and after stimulation  Comfort (Breast/Nipple): Soft / non-tender     Hold (Positioning): Assistance needed to correctly position infant at breast and maintain latch. Intervention(s): Breastfeeding basics reviewed;Support Pillows;Position options;Skin to skin  LATCH Score: 8  Lactation Tools Discussed/Used Tools: Pump Breast pump type: Manual Pump Review: Setup, frequency, and cleaning;Milk Storage Initiated by:: JW Date initiated::  04/21/17   Consult Status Consult Status: PRN    Andres Labrum 04/21/2017, 10:32 AM

## 2017-04-21 NOTE — Progress Notes (Signed)
PPD 2 SVD with 2nd degree  S:  Reports feeling ok - long nigt with clustering but doing well this am             Tolerating po/ No nausea or vomiting             Bleeding is light             Pain controlled with motrin             Up ad lib / ambulatory / voiding QS  Newborn breast feeding  / Circumcision done yesterday  O:   VS: BP 109/71   Pulse 93   Temp 97.9 F (36.6 C) (Oral)   Resp 18   Ht 5\' 9"  (1.753 m)   Wt 84.8 kg (187 lb)   SpO2 100%   Breastfeeding? Unknown   BMI 27.62 kg/m    LABS:              Recent Labs  04/19/17 0151 04/20/17 0606  WBC 14.2* 15.8*  HGB 12.3 9.0*  PLT 245 168               Blood type: --/--/O POS, O POS (07/23 0151)  Rubella: Nonimmune (12/20 0000)  - booster given                           Physical Exam:             Alert and oriented X3  Abdomen: soft, non-tender, non-distended              Fundus: firm, non-tender, U-1  Perineum: no edema  Lochia: light  Extremities: no edema, no calf pain or tenderness  A: PPD # 2   Doing well - stable status  P: Routine post partum orders  DC home - WOB booklet - instructions reviewed  Artelia Laroche CNM, MSN, [place] 04/21/2017, 9:03 AM

## 2017-04-21 NOTE — Discharge Summary (Signed)
Obstetric Discharge Summary Reason for Admission: onset of labor Prenatal Procedures: none Intrapartum Procedures: spontaneous vaginal delivery and epidural Postpartum Procedures: Rubella Ig and Tdap given Complications-Operative and Postpartum: 2nd degree perineal laceration Hemoglobin  Date Value Ref Range Status  04/20/2017 9.0 (L) 12.0 - 15.0 g/dL Final    Comment:    DELTA CHECK NOTED REPEATED TO VERIFY    HCT  Date Value Ref Range Status  04/20/2017 26.6 (L) 36.0 - 46.0 % Final    Physical Exam:  General: alert, cooperative and no distress Lochia: appropriate Uterine Fundus: firm Incision: healing well DVT Evaluation: No evidence of DVT seen on physical exam.  Discharge Diagnoses: Term Pregnancy-delivered and ABL anemia  Discharge Information: Date: 04/21/2017 Activity: pelvic rest Diet: routine Medications: PNV, Ibuprofen, Iron and magnesium oxide Condition: stable Instructions: refer to practice specific booklet Discharge to: home Follow-up Information    Brien Few, MD Follow up in 5 week(s).   Specialty:  Obstetrics and Gynecology Contact information: [place] [address] [phone]           Newborn Data: Live born female  Birth Weight: 7 lb 12.5 oz (3530 g) APGAR: 9, 9  Home with mother.  Jade Beck 04/21/2017, 9:22 AM

## 2017-04-22 NOTE — Anesthesia Postprocedure Evaluation (Signed)
Anesthesia Post Note  Patient: Jade Beck  Procedure(s) Performed: * No procedures listed *     Patient location during evaluation: PACU Anesthesia Type: Epidural Level of consciousness: awake and alert and patient cooperative Pain management: pain level controlled Vital Signs Assessment: post-procedure vital signs reviewed and stable Respiratory status: spontaneous breathing and respiratory function stable Cardiovascular status: stable Anesthetic complications: no    Last Vitals:  Vitals:   03/03/17 2031 03/03/17 2204  BP: 118/66 (!) 104/59  Pulse: 87 87  Resp: 17 17  Temp: 36.5 C     Last Pain:  Vitals:   03/03/17 2204  TempSrc:   PainSc: 0-No pain                 Mister Krahenbuhl S

## 2017-08-10 DIAGNOSIS — I77810 Thoracic aortic ectasia: Secondary | ICD-10-CM | POA: Diagnosis not present

## 2017-08-30 DIAGNOSIS — M357 Hypermobility syndrome: Secondary | ICD-10-CM | POA: Diagnosis not present

## 2017-08-30 DIAGNOSIS — Z8249 Family history of ischemic heart disease and other diseases of the circulatory system: Secondary | ICD-10-CM | POA: Diagnosis not present

## 2017-08-30 DIAGNOSIS — I77810 Thoracic aortic ectasia: Secondary | ICD-10-CM | POA: Diagnosis not present

## 2017-09-02 DIAGNOSIS — Z0000 Encounter for general adult medical examination without abnormal findings: Secondary | ICD-10-CM | POA: Diagnosis not present

## 2017-09-08 DIAGNOSIS — Z6823 Body mass index (BMI) 23.0-23.9, adult: Secondary | ICD-10-CM | POA: Diagnosis not present

## 2017-09-08 DIAGNOSIS — Z1389 Encounter for screening for other disorder: Secondary | ICD-10-CM | POA: Diagnosis not present

## 2017-09-08 DIAGNOSIS — I712 Thoracic aortic aneurysm, without rupture: Secondary | ICD-10-CM | POA: Diagnosis not present

## 2017-09-08 DIAGNOSIS — Z0000 Encounter for general adult medical examination without abnormal findings: Secondary | ICD-10-CM | POA: Diagnosis not present

## 2017-09-08 DIAGNOSIS — Z23 Encounter for immunization: Secondary | ICD-10-CM | POA: Diagnosis not present

## 2017-09-17 DIAGNOSIS — I712 Thoracic aortic aneurysm, without rupture: Secondary | ICD-10-CM | POA: Diagnosis not present

## 2017-09-17 DIAGNOSIS — R002 Palpitations: Secondary | ICD-10-CM | POA: Diagnosis not present

## 2017-09-17 DIAGNOSIS — R0600 Dyspnea, unspecified: Secondary | ICD-10-CM | POA: Diagnosis not present

## 2017-09-30 DIAGNOSIS — Z3009 Encounter for other general counseling and advice on contraception: Secondary | ICD-10-CM | POA: Diagnosis not present

## 2018-01-03 DIAGNOSIS — H35413 Lattice degeneration of retina, bilateral: Secondary | ICD-10-CM | POA: Diagnosis not present

## 2018-01-03 DIAGNOSIS — I77810 Thoracic aortic ectasia: Secondary | ICD-10-CM | POA: Diagnosis not present

## 2018-01-13 DIAGNOSIS — R002 Palpitations: Secondary | ICD-10-CM | POA: Diagnosis not present

## 2018-01-13 DIAGNOSIS — R0600 Dyspnea, unspecified: Secondary | ICD-10-CM | POA: Diagnosis not present

## 2018-04-04 DIAGNOSIS — Z30430 Encounter for insertion of intrauterine contraceptive device: Secondary | ICD-10-CM | POA: Diagnosis not present

## 2018-04-21 DIAGNOSIS — Z6823 Body mass index (BMI) 23.0-23.9, adult: Secondary | ICD-10-CM | POA: Diagnosis not present

## 2018-04-21 DIAGNOSIS — M25569 Pain in unspecified knee: Secondary | ICD-10-CM | POA: Diagnosis not present

## 2018-05-17 DIAGNOSIS — N6019 Diffuse cystic mastopathy of unspecified breast: Secondary | ICD-10-CM | POA: Diagnosis not present

## 2018-05-17 DIAGNOSIS — N644 Mastodynia: Secondary | ICD-10-CM | POA: Diagnosis not present

## 2018-05-17 DIAGNOSIS — Z30431 Encounter for routine checking of intrauterine contraceptive device: Secondary | ICD-10-CM | POA: Diagnosis not present

## 2018-05-20 DIAGNOSIS — M170 Bilateral primary osteoarthritis of knee: Secondary | ICD-10-CM | POA: Diagnosis not present

## 2018-07-11 DIAGNOSIS — D225 Melanocytic nevi of trunk: Secondary | ICD-10-CM | POA: Diagnosis not present

## 2018-07-11 DIAGNOSIS — D1801 Hemangioma of skin and subcutaneous tissue: Secondary | ICD-10-CM | POA: Diagnosis not present

## 2018-07-11 DIAGNOSIS — L858 Other specified epidermal thickening: Secondary | ICD-10-CM | POA: Diagnosis not present

## 2018-07-11 DIAGNOSIS — L821 Other seborrheic keratosis: Secondary | ICD-10-CM | POA: Diagnosis not present

## 2018-08-09 DIAGNOSIS — Z6823 Body mass index (BMI) 23.0-23.9, adult: Secondary | ICD-10-CM | POA: Diagnosis not present

## 2018-08-09 DIAGNOSIS — R197 Diarrhea, unspecified: Secondary | ICD-10-CM | POA: Diagnosis not present

## 2018-08-09 DIAGNOSIS — J111 Influenza due to unidentified influenza virus with other respiratory manifestations: Secondary | ICD-10-CM | POA: Diagnosis not present

## 2018-08-09 DIAGNOSIS — R1031 Right lower quadrant pain: Secondary | ICD-10-CM | POA: Diagnosis not present

## 2018-08-10 ENCOUNTER — Other Ambulatory Visit: Payer: Self-pay | Admitting: Internal Medicine

## 2018-08-10 ENCOUNTER — Ambulatory Visit
Admission: RE | Admit: 2018-08-10 | Discharge: 2018-08-10 | Disposition: A | Discharge status: Home / Self Care | Payer: BLUE CROSS/BLUE SHIELD | Source: Ambulatory Visit | Attending: Internal Medicine | Admitting: Internal Medicine

## 2018-08-10 DIAGNOSIS — R1031 Right lower quadrant pain: Secondary | ICD-10-CM

## 2018-08-10 DIAGNOSIS — R197 Diarrhea, unspecified: Secondary | ICD-10-CM | POA: Diagnosis not present

## 2018-08-10 MED ORDER — IOPAMIDOL (ISOVUE-300) INJECTION 61%
100.0000 mL | Freq: Once | INTRAVENOUS | Status: AC | PRN
  Administered 2018-08-10: 100 mL via INTRAVENOUS

## 2018-08-30 DIAGNOSIS — I77810 Thoracic aortic ectasia: Secondary | ICD-10-CM | POA: Diagnosis not present

## 2018-09-05 DIAGNOSIS — M357 Hypermobility syndrome: Secondary | ICD-10-CM | POA: Diagnosis not present

## 2018-09-05 DIAGNOSIS — Z0189 Encounter for other specified special examinations: Secondary | ICD-10-CM | POA: Diagnosis not present

## 2018-09-05 DIAGNOSIS — R071 Chest pain on breathing: Secondary | ICD-10-CM | POA: Diagnosis not present

## 2018-09-09 DIAGNOSIS — R82998 Other abnormal findings in urine: Secondary | ICD-10-CM | POA: Diagnosis not present

## 2018-09-09 DIAGNOSIS — Z0000 Encounter for general adult medical examination without abnormal findings: Secondary | ICD-10-CM | POA: Diagnosis not present

## 2018-09-13 DIAGNOSIS — Z1389 Encounter for screening for other disorder: Secondary | ICD-10-CM | POA: Diagnosis not present

## 2018-09-13 DIAGNOSIS — Z23 Encounter for immunization: Secondary | ICD-10-CM | POA: Diagnosis not present

## 2018-09-13 DIAGNOSIS — I712 Thoracic aortic aneurysm, without rupture: Secondary | ICD-10-CM | POA: Diagnosis not present

## 2018-09-13 DIAGNOSIS — Z0000 Encounter for general adult medical examination without abnormal findings: Secondary | ICD-10-CM | POA: Diagnosis not present

## 2019-03-09 DIAGNOSIS — B360 Pityriasis versicolor: Secondary | ICD-10-CM | POA: Diagnosis not present

## 2019-03-19 IMAGING — CT CT ABD-PELV W/ CM
1 of 2 series · 13 of 32 positions shown, 18 images · IV contrast (APPLIED)
Comparison: None.

CLINICAL DATA: Abdominal pain and fever.  Nausea.  Diarrhea.

EXAM:
CT ABDOMEN AND PELVIS WITH CONTRAST
TECHNIQUE: Multidetector CT imaging of the abdomen and pelvis was performed
using the standard protocol following bolus administration of
intravenous contrast.
CONTRAST:  100mL 3JJBZL-XEE IOPAMIDOL (3JJBZL-XEE) INJECTION 61%

[Series 2: abd/pelvis w/cm · axial · 0.76mm/px · z∈[-487,-87]mm · 13 of 92 slices shown, 18 images]
[im 6/92  soft-tissue]
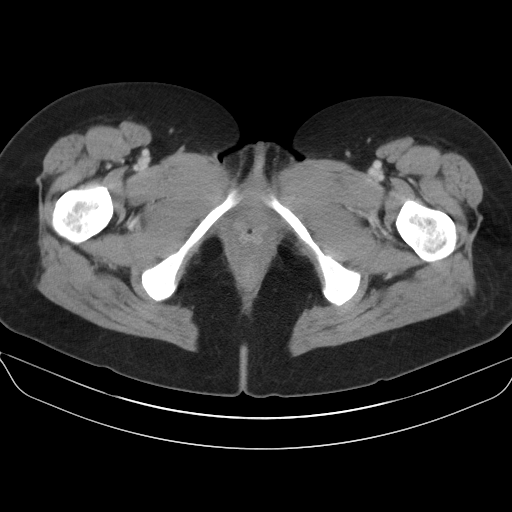
[im 6/92  bone]
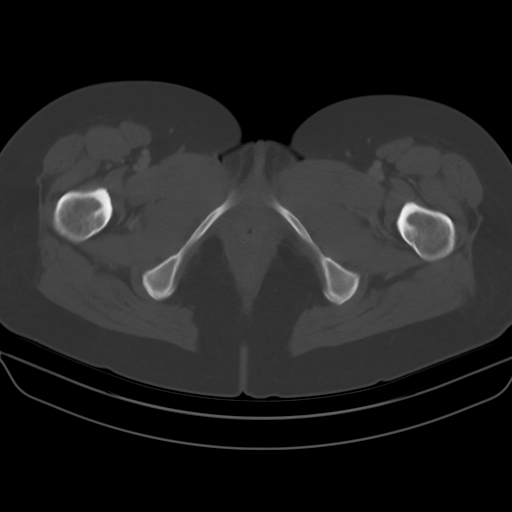
[im 17/92  soft-tissue]
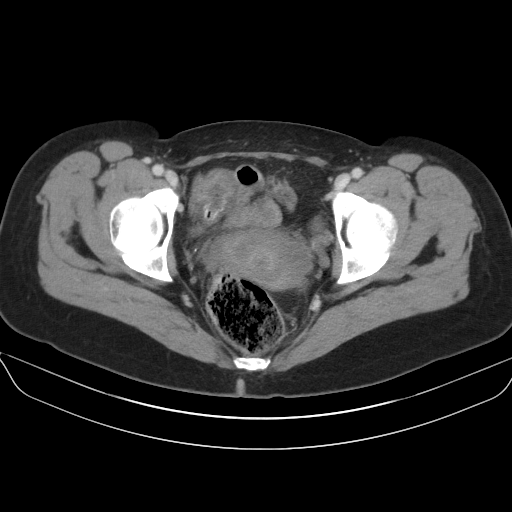
[im 22/92  soft-tissue]
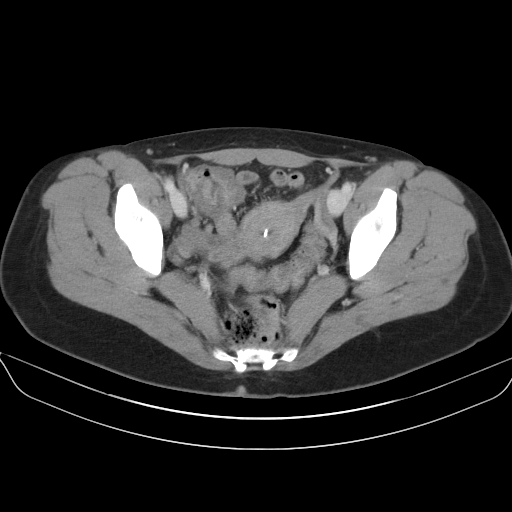
[im 27/92  soft-tissue]
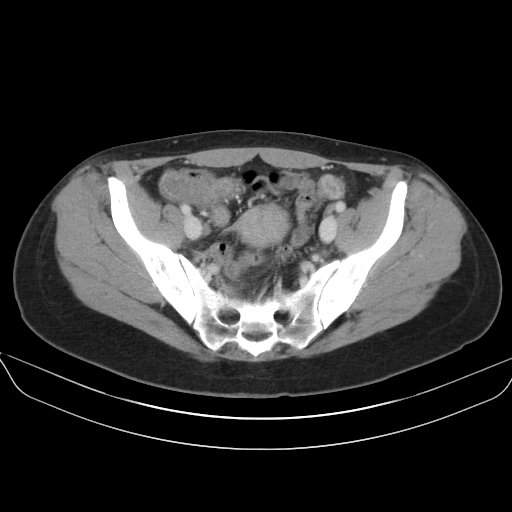
[im 38/92  soft-tissue]
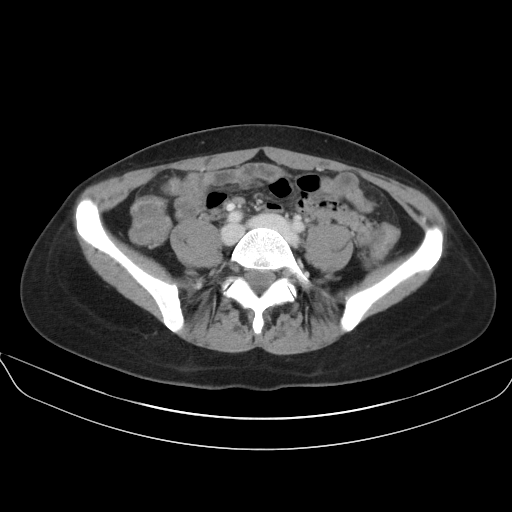
[im 43/92  soft-tissue]
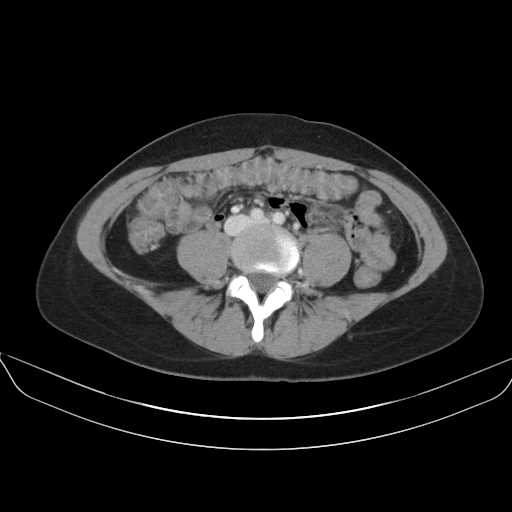
[im 49/92  soft-tissue]
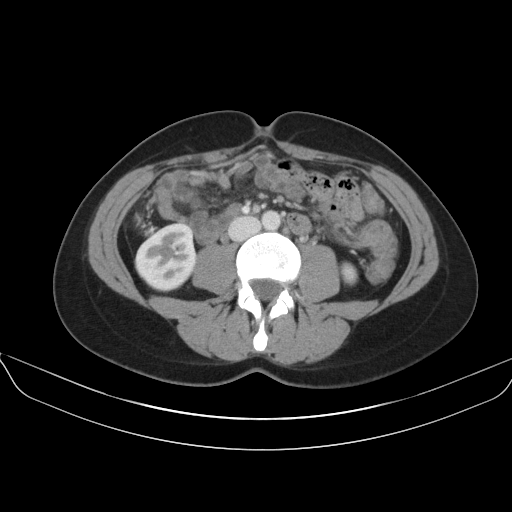
[im 59/92  soft-tissue]
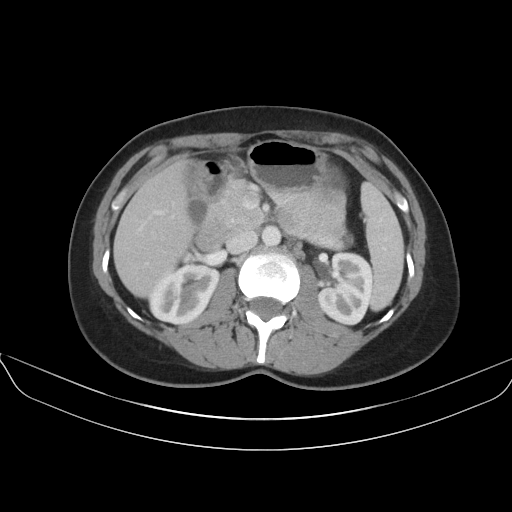
[im 65/92  soft-tissue]
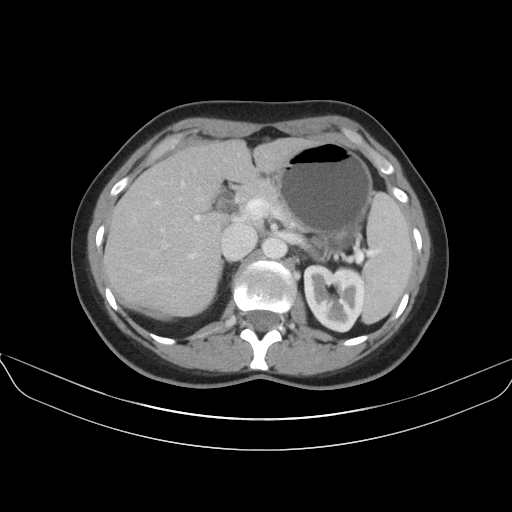
[im 65/92  bone]
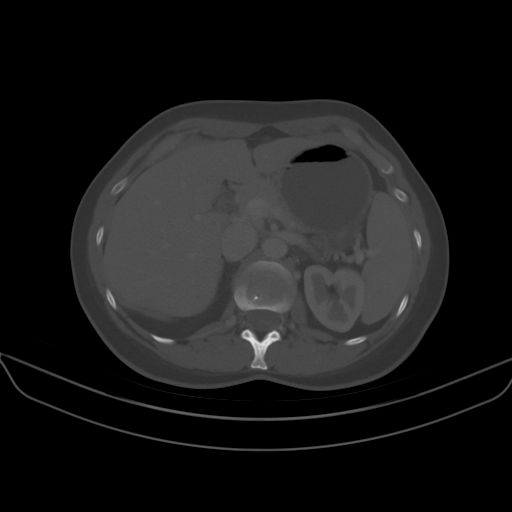
[im 70/92  soft-tissue]
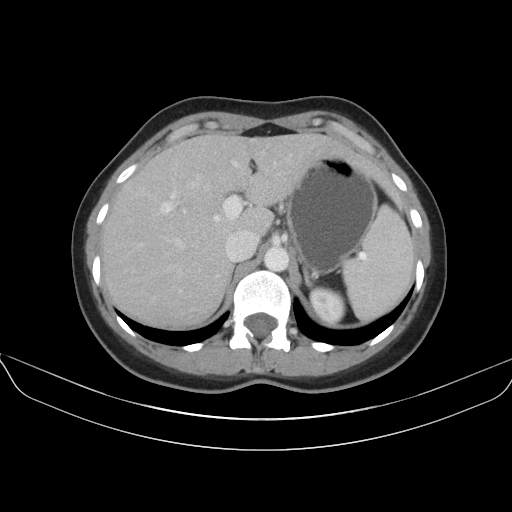
[im 70/92  lung]
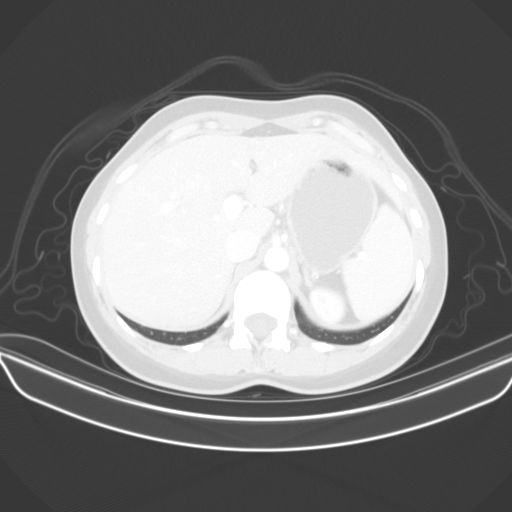
[im 75/92  lung]
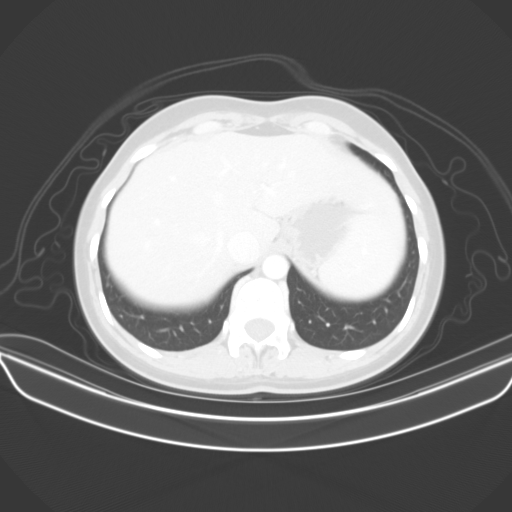
[im 81/92  soft-tissue]
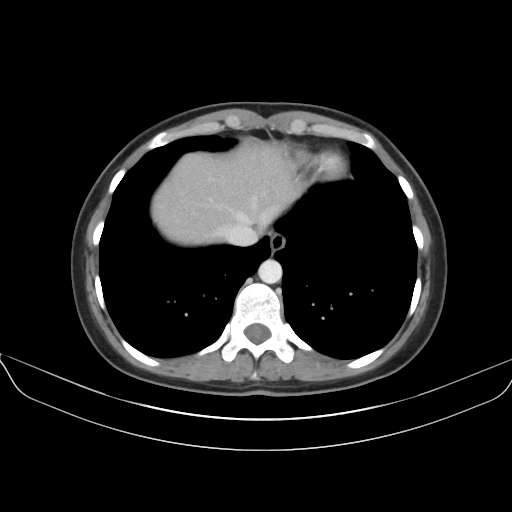
[im 81/92  lung]
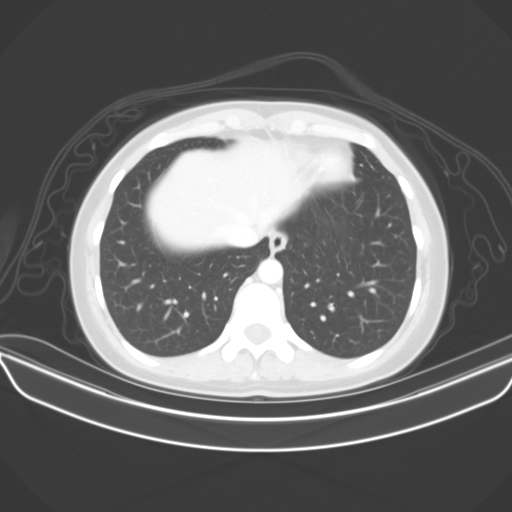
[im 86/92  soft-tissue]
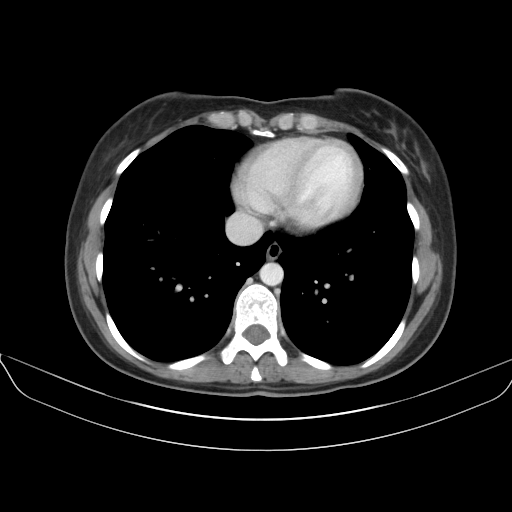
[im 86/92  lung]
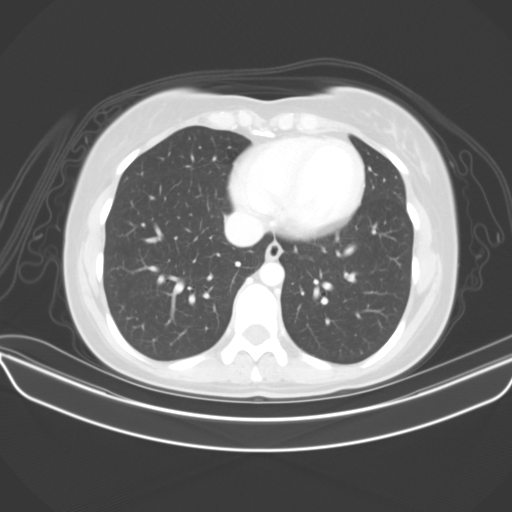

[13 of 32 positions shown; findings below may reference images not displayed]

FINDINGS: Lower chest: Lung bases are clear.

Hepatobiliary: No focal liver lesions are apparent. Gallbladder wall
is not appreciably thickened. There is no biliary duct dilatation.

Pancreas: No pancreatic mass or inflammatory focus.

Spleen: No splenic lesions are evident.

Adrenals/Urinary Tract: Adrenals bilaterally appear normal. Kidneys
bilaterally show no evident mass or hydronephrosis on either side.
There is no evident renal or ureteral calculus on either side.
Urinary bladder is midline with wall thickness within normal limits.

Stomach/Bowel: There is moderate fluid in the stomach. No
appreciable gastric wall thickening. Colon is largely decompressed.
Note that there is moderate stool in the rectum. The rectal wall
does not appear appreciably thickened. There is no appreciable bowel
wall or mesenteric thickening. No evident bowel obstruction. No free
air or portal venous air.

Vascular/Lymphatic: No abdominal aortic aneurysm. No vascular
lesions are appreciable. No evident adenopathy in the abdomen or
pelvis by size criteria. Several subcentimeter right lower quadrant
lymph nodes are evident.

Reproductive: The uterus is anteverted. Intrauterine device
positioned within the endometrium. There are physiologic follicles
in the ovaries. There is no pelvic mass beyond physiologic
follicles.

Other: Appendix appears unremarkable. No evident abscess or ascites
in the abdomen or pelvis.

Musculoskeletal: No appreciable blastic or lytic bone lesions. There
are no appreciable intramuscular lesions or abdominal wall lesions.
IMPRESSION: 1. Subcentimeter lymph nodes localized in the right lower quadrant.
In the appropriate clinical setting, this finding could represent a
degree of mesenteric adenitis. No adenopathy present on this study
by size criteria.

2. Appendix appears normal. No abscess in the abdomen or pelvis. No
evident bowel obstruction. Note that colon is largely decompressed.

3.  No evident renal or ureteral calculus.  No hydronephrosis.

4.  Intrauterine device positioned within the endometrial region.

## 2019-03-21 DIAGNOSIS — L237 Allergic contact dermatitis due to plants, except food: Secondary | ICD-10-CM | POA: Diagnosis not present

## 2019-03-21 DIAGNOSIS — L298 Other pruritus: Secondary | ICD-10-CM | POA: Diagnosis not present

## 2019-07-07 DIAGNOSIS — Z23 Encounter for immunization: Secondary | ICD-10-CM | POA: Diagnosis not present

## 2019-07-19 DIAGNOSIS — R0600 Dyspnea, unspecified: Secondary | ICD-10-CM | POA: Diagnosis not present

## 2019-07-19 DIAGNOSIS — R002 Palpitations: Secondary | ICD-10-CM | POA: Diagnosis not present

## 2019-07-20 DIAGNOSIS — R0600 Dyspnea, unspecified: Secondary | ICD-10-CM | POA: Diagnosis not present

## 2019-09-14 DIAGNOSIS — R7989 Other specified abnormal findings of blood chemistry: Secondary | ICD-10-CM | POA: Diagnosis not present

## 2019-09-14 DIAGNOSIS — I712 Thoracic aortic aneurysm, without rupture: Secondary | ICD-10-CM | POA: Diagnosis not present

## 2019-09-14 DIAGNOSIS — Z0000 Encounter for general adult medical examination without abnormal findings: Secondary | ICD-10-CM | POA: Diagnosis not present

## 2019-09-19 DIAGNOSIS — Z1331 Encounter for screening for depression: Secondary | ICD-10-CM | POA: Diagnosis not present

## 2019-09-19 DIAGNOSIS — Z0000 Encounter for general adult medical examination without abnormal findings: Secondary | ICD-10-CM | POA: Diagnosis not present

## 2019-10-23 DIAGNOSIS — J069 Acute upper respiratory infection, unspecified: Secondary | ICD-10-CM | POA: Diagnosis not present

## 2019-10-23 DIAGNOSIS — Z1152 Encounter for screening for COVID-19: Secondary | ICD-10-CM | POA: Diagnosis not present

## 2019-10-23 DIAGNOSIS — J302 Other seasonal allergic rhinitis: Secondary | ICD-10-CM | POA: Diagnosis not present

## 2019-10-27 DIAGNOSIS — J302 Other seasonal allergic rhinitis: Secondary | ICD-10-CM | POA: Diagnosis not present

## 2019-10-27 DIAGNOSIS — J069 Acute upper respiratory infection, unspecified: Secondary | ICD-10-CM | POA: Diagnosis not present

## 2019-10-27 DIAGNOSIS — Z1152 Encounter for screening for COVID-19: Secondary | ICD-10-CM | POA: Diagnosis not present

## 2019-11-29 DIAGNOSIS — Z23 Encounter for immunization: Secondary | ICD-10-CM | POA: Diagnosis not present

## 2019-12-25 DIAGNOSIS — Z23 Encounter for immunization: Secondary | ICD-10-CM | POA: Diagnosis not present

## 2020-01-29 DIAGNOSIS — R002 Palpitations: Secondary | ICD-10-CM | POA: Diagnosis not present

## 2020-01-29 DIAGNOSIS — R0600 Dyspnea, unspecified: Secondary | ICD-10-CM | POA: Diagnosis not present

## 2020-04-05 DIAGNOSIS — M549 Dorsalgia, unspecified: Secondary | ICD-10-CM | POA: Diagnosis not present

## 2020-10-03 DIAGNOSIS — Z0000 Encounter for general adult medical examination without abnormal findings: Secondary | ICD-10-CM | POA: Diagnosis not present

## 2020-10-09 DIAGNOSIS — Z23 Encounter for immunization: Secondary | ICD-10-CM | POA: Diagnosis not present

## 2020-10-09 DIAGNOSIS — Z0000 Encounter for general adult medical examination without abnormal findings: Secondary | ICD-10-CM | POA: Diagnosis not present

## 2020-12-12 DIAGNOSIS — R058 Other specified cough: Secondary | ICD-10-CM | POA: Diagnosis not present

## 2020-12-12 DIAGNOSIS — J019 Acute sinusitis, unspecified: Secondary | ICD-10-CM | POA: Diagnosis not present

## 2021-01-15 DIAGNOSIS — Z124 Encounter for screening for malignant neoplasm of cervix: Secondary | ICD-10-CM | POA: Diagnosis not present

## 2021-01-15 DIAGNOSIS — Z113 Encounter for screening for infections with a predominantly sexual mode of transmission: Secondary | ICD-10-CM | POA: Diagnosis not present

## 2021-01-15 DIAGNOSIS — Z1231 Encounter for screening mammogram for malignant neoplasm of breast: Secondary | ICD-10-CM | POA: Diagnosis not present

## 2021-01-15 DIAGNOSIS — Z01411 Encounter for gynecological examination (general) (routine) with abnormal findings: Secondary | ICD-10-CM | POA: Diagnosis not present

## 2021-01-15 DIAGNOSIS — Z01419 Encounter for gynecological examination (general) (routine) without abnormal findings: Secondary | ICD-10-CM | POA: Diagnosis not present

## 2021-01-15 DIAGNOSIS — Z6823 Body mass index (BMI) 23.0-23.9, adult: Secondary | ICD-10-CM | POA: Diagnosis not present

## 2021-02-03 DIAGNOSIS — R002 Palpitations: Secondary | ICD-10-CM | POA: Diagnosis not present

## 2021-02-03 DIAGNOSIS — R0600 Dyspnea, unspecified: Secondary | ICD-10-CM | POA: Diagnosis not present

## 2021-08-14 DIAGNOSIS — Z23 Encounter for immunization: Secondary | ICD-10-CM | POA: Diagnosis not present

## 2021-11-04 DIAGNOSIS — Z0000 Encounter for general adult medical examination without abnormal findings: Secondary | ICD-10-CM | POA: Diagnosis not present

## 2021-11-10 DIAGNOSIS — Z1331 Encounter for screening for depression: Secondary | ICD-10-CM | POA: Diagnosis not present

## 2021-11-10 DIAGNOSIS — R7989 Other specified abnormal findings of blood chemistry: Secondary | ICD-10-CM | POA: Diagnosis not present

## 2021-11-10 DIAGNOSIS — Z1339 Encounter for screening examination for other mental health and behavioral disorders: Secondary | ICD-10-CM | POA: Diagnosis not present

## 2021-11-10 DIAGNOSIS — Z0000 Encounter for general adult medical examination without abnormal findings: Secondary | ICD-10-CM | POA: Diagnosis not present

## 2021-12-10 DIAGNOSIS — R0600 Dyspnea, unspecified: Secondary | ICD-10-CM | POA: Diagnosis not present

## 2021-12-16 DIAGNOSIS — R0600 Dyspnea, unspecified: Secondary | ICD-10-CM | POA: Diagnosis not present

## 2021-12-16 DIAGNOSIS — R002 Palpitations: Secondary | ICD-10-CM | POA: Diagnosis not present

## 2022-07-02 ENCOUNTER — Encounter: Payer: Self-pay | Admitting: Gastroenterology

## 2022-07-22 ENCOUNTER — Ambulatory Visit (INDEPENDENT_AMBULATORY_CARE_PROVIDER_SITE_OTHER): Payer: BC Managed Care – PPO | Admitting: Gastroenterology

## 2022-07-22 ENCOUNTER — Encounter: Payer: Self-pay | Admitting: Gastroenterology

## 2022-07-22 VITALS — BP 120/70 | HR 90 | Ht 69.0 in | Wt 152.0 lb

## 2022-07-22 DIAGNOSIS — K5900 Constipation, unspecified: Secondary | ICD-10-CM

## 2022-07-22 MED ORDER — NA SULFATE-K SULFATE-MG SULF 17.5-3.13-1.6 GM/177ML PO SOLN: 1.0000 | Freq: Once | ORAL | 0 refills | Status: AC

## 2022-07-22 NOTE — Patient Instructions (Signed)
_______________________________________________________  If you are age 43 or older, your body mass index should be between 23-30. Your Body mass index is 22.45 kg/m. If this is out of the aforementioned range listed, please consider follow up with your Primary Care Provider.  If you are age 35 or younger, your body mass index should be between 19-25. Your Body mass index is 22.45 kg/m. If this is out of the aformentioned range listed, please consider follow up with your Primary Care Provider.   ________________________________________________________  The [facility] providers would like to encourage you to use [facility] to communicate with providers for non-urgent requests or questions.  Due to long hold times on the telephone, sending your provider a message by [facility] may be a faster and more efficient way to get a response.  Please allow 48 business hours for a response.  Please remember that this is for non-urgent requests.  _______________________________________________________  Jade Beck have been scheduled for a colonoscopy. Please follow written instructions given to you at your visit today.  Please pick up your prep supplies at the pharmacy within the next 1-3 days. If you use inhalers (even only as needed), please bring them with you on the day of your procedure.  Due to recent changes in healthcare laws, you may see the results of your imaging and laboratory studies on MyChart before your provider has had a chance to review them.  We understand that in some cases there may be results that are confusing or concerning to you. Not all laboratory results come back in the same time frame and the provider may be waiting for multiple results in order to interpret others.  Please give [place] 48 hours in order for your provider to thoroughly review all the results before contacting the office for clarification of your results.   It was a pleasure to see you today!  Thank you for trusting me with your  gastrointestinal care!

## 2022-07-22 NOTE — Progress Notes (Signed)
[facility] Consult Note:  History: Jade Beck 07/22/2022  Referring provider: Velna Hatchet, MD  Reason for consult/chief complaint: Change in Bowels (Constipation and does not feel she is emptying completely. Pt states that she has to strain to have bowel movement. Patient states that most of this change happen after having her baby. She has a lot of abdomen tenderness on right side. )   Subjective  HPI:  This is a very pleasant 41 year old woman here for evaluation of constipation.  It has been occurring for about 5 years since the birth of her first child and seems worse within the last year.  She feels a pressure in the rectum or pelvis with the need for bowel movement, but has to sit for long periods of time and strain only to produce small amounts of stool.  She recalls a difficult delivery of her child with a "bad tear" repaired by OB/GYN.  She denies rectal bleeding, appetite is good weight stable.  She is taken some occasional stool softeners without much relief.  She sometimes applies pressure to the bulging perineal area to aid with bowel movements, and has not had to disimpact herself.  Although she clearly noticed this after childbirth and feels that is likely the explanation somehow, she has also become increasingly concerned after reading about the increased incidence of colorectal cancer in her age group.  Fortunately, no family history of colon or rectal cancer.   ROS:  Review of Systems  Constitutional:  Negative for appetite change and unexpected weight change.  HENT:  Negative for mouth sores and voice change.   Eyes:  Negative for pain and redness.  Respiratory:  Negative for cough and shortness of breath.   Cardiovascular:  Negative for chest pain and palpitations.  Genitourinary:  Negative for dysuria and hematuria.  Musculoskeletal:  Negative for arthralgias and myalgias.  Skin:  Negative for pallor and rash.  Neurological:  Negative for  weakness and headaches.  Hematological:  Negative for adenopathy.     Past Medical History: Past Medical History:  Diagnosis Date   Anemia    pregnancy induced     Past Surgical History: Past Surgical History:  Procedure Laterality Date   NASAL RECONSTRUCTION WITH SEPTAL REPAIR     WISDOM TOOTH EXTRACTION       Family History: Family History  Problem Relation Age of Onset   Diabetes Father    Heart disease Brother     Social History: Social History   Socioeconomic History   Marital status: Married    Spouse name: Not on file   Number of children: 1   Years of education: Not on file   Highest education level: Not on file  Occupational History   Occupation: self employed  Tobacco Use   Smoking status: Never   Smokeless tobacco: Never  Substance and Sexual Activity   Alcohol use: No   Drug use: No   Sexual activity: Yes  Other Topics Concern   Not on file  Social History Narrative   Not on file   Social Determinants of Health   Financial Resource Strain: Not on file  Food Insecurity: Not on file  Transportation Needs: Not on file  Physical Activity: Not on file  Stress: Not on file  Social Connections: Not on file    Allergies: No Known Allergies  Outpatient Meds: Current Outpatient Medications  Medication Sig Dispense Refill   Na Sulfate-K Sulfate-Mg Sulf 17.5-3.13-1.6 GM/177ML SOLN Take 1 kit by mouth once  for 1 dose. 354 mL 0   No current facility-administered medications for this visit.      ___________________________________________________________________ Objective   Exam:  BP 120/70   Pulse 90   Ht _0  (1.753 m)   Wt 152 lb (68.9 kg)   SpO2 97%   BMI 22.45 kg/m  Wt Readings from Last 3 Encounters:  07/22/22 152 lb (68.9 kg)  04/19/17 187 lb (84.8 kg)  03/03/17 176 lb 1.9 oz (79.9 kg)    General: Well-appearing Eyes: sclera anicteric, no redness ENT: oral mucosa moist without lesions, no cervical or supraclavicular  lymphadenopathy CV: Regular without murmur, no JVD, no peripheral edema Resp: clear to auscultation bilaterally, normal RR and effort noted GI: soft, no tenderness, with active bowel sounds. No guarding or palpable organomegaly noted. Skin; warm and dry, no rash or jaundice noted Neuro: awake, alert and oriented x 3. Normal gross motor function and fluent speech No rectal exam performed today. Labs:  No data or PCP records to review  Assessment: Encounter Diagnosis  Name Primary?   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  We discussed the nature of that and she was relieved to know that it has a probable explanation.  Other obstructive cause seems less likely, but I agree that a colonoscopy is warranted to rule out out for certain.  Plan:  Oralia was agreeable to a colonoscopy after thorough discussion of procedure and risks.  She will start taking a magnesium capsule at bedtime.  She had done that at 1 point and it seemed to be helpful and also made her sleep better.  If that is not sufficiently helpful, she can start low-dose MiraLAX when needed.  Slightly raised feet on a toddler stool when toileting.  Thank you for the courtesy of this consult.  Please call me with any questions or concerns.  Nelida Meuse III  CC: Referring provider noted above

## 2022-07-28 ENCOUNTER — Encounter: Payer: Self-pay | Admitting: Gastroenterology

## 2022-07-31 DIAGNOSIS — Z01419 Encounter for gynecological examination (general) (routine) without abnormal findings: Secondary | ICD-10-CM | POA: Diagnosis not present

## 2022-07-31 DIAGNOSIS — Z1231 Encounter for screening mammogram for malignant neoplasm of breast: Secondary | ICD-10-CM | POA: Diagnosis not present

## 2022-07-31 DIAGNOSIS — Z6822 Body mass index (BMI) 22.0-22.9, adult: Secondary | ICD-10-CM | POA: Diagnosis not present

## 2022-08-03 ENCOUNTER — Telehealth: Payer: Self-pay | Admitting: Gastroenterology

## 2022-08-03 NOTE — Telephone Encounter (Signed)
Inbound call from patient stating she has a bad head cold. Patient stated she has no fever but would like to speak to a provider. Please advise.  Thank you

## 2022-08-03 NOTE — Telephone Encounter (Signed)
Returned call to patient. She states that her family has been sick and she developed a head cold over the weekend. Pt states that she really can't breathe and feels like she "was hit by a truck". I recommended that we reschedule her procedure. Pt's colonoscopy has been rescheduled to Wednesday, 08/12/22 at 1:30 pm. Pt is aware that she will need to arrive at 12:30 pm with a care partner. Pt will update her current instructions. Pt verbalized understanding and had no concerns at the end of the call.

## 2022-08-04 ENCOUNTER — Encounter: Payer: BC Managed Care – PPO | Admitting: Gastroenterology

## 2022-08-04 DIAGNOSIS — J0100 Acute maxillary sinusitis, unspecified: Secondary | ICD-10-CM | POA: Diagnosis not present

## 2022-08-04 DIAGNOSIS — R0981 Nasal congestion: Secondary | ICD-10-CM | POA: Diagnosis not present

## 2022-08-04 DIAGNOSIS — R051 Acute cough: Secondary | ICD-10-CM | POA: Diagnosis not present

## 2022-08-04 NOTE — Telephone Encounter (Signed)
Patient called wanting to reschedule her procedure again because she went to see her doctor and he advised her that the antibiotics he started her on will interfere with her colonoscopy. She also said she still might not be feeling better by then. Please Advise

## 2022-08-12 ENCOUNTER — Ambulatory Visit (AMBULATORY_SURGERY_CENTER): Payer: BC Managed Care – PPO | Admitting: Gastroenterology

## 2022-08-12 ENCOUNTER — Encounter: Payer: Self-pay | Admitting: Gastroenterology

## 2022-08-12 VITALS — BP 97/70 | HR 73 | Temp 97.5°F | Resp 12 | Ht 69.0 in | Wt 152.0 lb

## 2022-08-12 DIAGNOSIS — D3A026 Benign carcinoid tumor of the rectum: Secondary | ICD-10-CM | POA: Diagnosis not present

## 2022-08-12 DIAGNOSIS — K5909 Other constipation: Secondary | ICD-10-CM

## 2022-08-12 DIAGNOSIS — C7A026 Malignant carcinoid tumor of the rectum: Secondary | ICD-10-CM | POA: Diagnosis not present

## 2022-08-12 DIAGNOSIS — D128 Benign neoplasm of rectum: Secondary | ICD-10-CM

## 2022-08-12 DIAGNOSIS — K6289 Other specified diseases of anus and rectum: Secondary | ICD-10-CM | POA: Diagnosis not present

## 2022-08-12 MED ORDER — SODIUM CHLORIDE 0.9 % IV SOLN: 500.0000 mL | Freq: Once | INTRAVENOUS | Status: DC

## 2022-08-12 NOTE — Patient Instructions (Addendum)
Handouts Provided:  Polyps  - Patient has a contact number available for emergencies. The signs and symptoms of potential delayed complications were discussed with the patient. Return to normal activities tomorrow. Written discharge instructions were provided to the patient. - Resume previous diet. - Continue present medications. If magnesium not working for constipation or not tolerated, start miralax one half to one capful daily. - Await pathology results. - Repeat colonoscopy is recommended for surveillance. The colonoscopy date will be determined after pathology results from today's exam become available for review.  YOU HAD AN ENDOSCOPIC PROCEDURE TODAY AT [facility]:   Refer to the procedure report that was given to you for any specific questions about what was found during the examination.  If the procedure report does not answer your questions, please call your gastroenterologist to clarify.  If you requested that your care partner not be given the details of your procedure findings, then the procedure report has been included in a sealed envelope for you to review at your convenience later.  YOU SHOULD EXPECT: Some feelings of bloating in the abdomen. Passage of more gas than usual.  Walking can help get rid of the air that was put into your GI tract during the procedure and reduce the bloating. If you had a lower endoscopy (such as a colonoscopy or flexible sigmoidoscopy) you may notice spotting of blood in your stool or on the toilet paper. If you underwent a bowel prep for your procedure, you may not have a normal bowel movement for a few days.  Please Note:  You might notice some irritation and congestion in your nose or some drainage.  This is from the oxygen used during your procedure.  There is no need for concern and it should clear up in a day or so.  SYMPTOMS TO REPORT IMMEDIATELY:  Following lower endoscopy (colonoscopy or flexible sigmoidoscopy):  Excessive  amounts of blood in the stool  Significant tenderness or worsening of abdominal pains  Swelling of the abdomen that is new, acute  Fever of 100F or higher  For urgent or emergent issues, a gastroenterologist can be reached at any hour by calling [phone]. Do not use MyChart messaging for urgent concerns.    DIET:  We do recommend a small meal at first, but then you may proceed to your regular diet.  Drink plenty of fluids but you should avoid alcoholic beverages for 24 hours.  ACTIVITY:  You should plan to take it easy for the rest of today and you should NOT DRIVE or use heavy machinery until tomorrow (because of the sedation medicines used during the test).    FOLLOW UP: Our staff will call the number listed on your records the next business day following your procedure.  We will call around 7:15- 8:00 am to check on you and address any questions or concerns that you may have regarding the information given to you following your procedure. If we do not reach you, we will leave a message.     If any biopsies were taken you will be contacted by phone or by letter within the next 1-3 weeks.  Please call us at [phone] if you have not heard about the biopsies in 3 weeks.    SIGNATURES/CONFIDENTIALITY: You and/or your care partner have signed paperwork which will be entered into your electronic medical record.  These signatures attest to the fact that that the information above on your After Visit Summary has been reviewed and  is understood.  Full responsibility of the confidentiality of this discharge information lies with you and/or your care-partner.

## 2022-08-12 NOTE — Progress Notes (Signed)
A and O x3. Report to RN. Tolerated MAC anesthesia well. 

## 2022-08-12 NOTE — Progress Notes (Signed)
No changes to clinical history since GI office visit on 07/22/22.  The patient is appropriate for an endoscopic procedure in the ambulatory setting.  - Wilfrid Lund, MD

## 2022-08-12 NOTE — Progress Notes (Signed)
Pt's states no medical or surgical changes since previsit or office visit. 

## 2022-08-12 NOTE — Op Note (Signed)
[facility] Patient Name: Jade Beck Procedure Date: 08/12/2022 1:29 PM MRN: [id number] Endoscopist: Mallie Mussel L. Loletha Carrow , MD, [id number] Age: 41 Referring MD:  Date of Birth: Oct 25, 1980 Gender: Female Account #: [id number] Procedure:                Colonoscopy Indications:              Constipation Medicines:                Monitored Anesthesia Care Procedure:                Pre-Anesthesia Assessment:                           - Prior to the procedure, a History and Physical                            was performed, and patient medications and                            allergies were reviewed. The patient's tolerance of                            previous anesthesia was also reviewed. The risks                            and benefits of the procedure and the sedation                            options and risks were discussed with the patient.                            All questions were answered, and informed consent                            was obtained. Prior Anticoagulants: The patient has                            taken no anticoagulant or antiplatelet agents. ASA                            Grade Assessment: II - A patient with mild systemic                            disease. After reviewing the risks and benefits,                            the patient was deemed in satisfactory condition to                            undergo the procedure.                           After obtaining informed consent, the colonoscope  was passed under direct vision. Throughout the                            procedure, the patient's blood pressure, pulse, and                            oxygen saturations were monitored continuously. The                            CF [id number] #[id number] was introduced through the anus                            and advanced to the the terminal ileum, with                            identification of the appendiceal orifice and IC                             valve. The colonoscopy was performed without                            difficulty. The patient tolerated the procedure                            well. The quality of the bowel preparation was                            excellent. The terminal ileum, ileocecal valve,                            appendiceal orifice, and rectum were photographed. Scope In: 1:37:26 PM Scope Out: 1:55:56 PM Scope Withdrawal Time: 0 hours 13 minutes 51 seconds  Total Procedure Duration: 0 hours 18 minutes 30 seconds  Findings:                 The perianal and digital rectal examinations were                            normal. Pre-sedation exam with mildy decreased                            resting ST, normal voluntary ST and puborectalis,                            normal rectal descent when bearing down.                           The terminal ileum appeared normal.                           Repeat examination of right colon under NBI                            performed.  A 6 mm polyp was found in the proximal rectum. The                            polyp was sessile. The polyp was removed with a                            cold snare. Resection and retrieval were complete.                           One 6-8 mm submucosal nodule with normal overlying                            mucosa was found in the distal rectum. "Tunneled"                            biopsies were taken with a cold forceps for                            histology.                           The exam was otherwise without abnormality on                            direct and retroflexion views. Complications:            No immediate complications. Estimated Blood Loss:     Estimated blood loss was minimal. Impression:               - The examined portion of the ileum was normal.                           - One 6 mm polyp in the proximal rectum, removed                            with a cold  snare. Resected and retrieved.                           - Submucosal nodule in the distal rectum. Biopsied.                            (possible carcinoid)                           - The examination was otherwise normal on direct                            and retroflexion views. Recommendation:           - Patient has a contact number available for                            emergencies. The signs and symptoms of potential  delayed complications were discussed with the                            patient. Return to normal activities tomorrow.                            Written discharge instructions were provided to the                            patient.                           - Resume previous diet.                           - Continue present medications. If magnesium not                            working for constipation or not tolerated, start                            miralax one half to one capful daily.                           - Await pathology results.                           - Repeat colonoscopy is recommended for                            surveillance. The colonoscopy date will be                            determined after pathology results from today's                            exam become available for review. [place] L. Loletha Carrow, MD 08/12/2022 2:04:21 PM This report has been signed electronically.

## 2022-08-12 NOTE — Progress Notes (Signed)
Called to room to assist during endoscopic procedure.  Patient ID and intended procedure confirmed with present staff. Received instructions for my participation in the procedure from the performing physician.  

## 2022-08-13 ENCOUNTER — Telehealth: Payer: Self-pay | Admitting: *Deleted

## 2022-08-13 NOTE — Telephone Encounter (Signed)
Attempted to call patient for their post-procedure follow-up call. No answer. Unable to leave voicemail due to patient's mailbox being full. If patient has any questions or concerns regarding their procedure yesterday at [facility] please give [place] a call.

## 2022-08-24 ENCOUNTER — Other Ambulatory Visit: Payer: BC Managed Care – PPO

## 2022-08-24 ENCOUNTER — Other Ambulatory Visit: Payer: Self-pay

## 2022-08-24 DIAGNOSIS — D3A026 Benign carcinoid tumor of the rectum: Secondary | ICD-10-CM

## 2022-08-25 LAB — CHROMOGRANIN A: Chromogranin A (ng/mL): 111.2 ng/mL — ABNORMAL HIGH (ref 0.0–101.8)

## 2022-08-26 ENCOUNTER — Encounter: Payer: Self-pay | Admitting: Gastroenterology

## 2022-08-26 ENCOUNTER — Other Ambulatory Visit: Payer: Self-pay

## 2022-08-26 DIAGNOSIS — D3A026 Benign carcinoid tumor of the rectum: Secondary | ICD-10-CM

## 2022-08-27 ENCOUNTER — Other Ambulatory Visit: Payer: Self-pay

## 2022-08-27 ENCOUNTER — Encounter: Payer: Self-pay | Admitting: Gastroenterology

## 2022-08-27 DIAGNOSIS — D3A026 Benign carcinoid tumor of the rectum: Secondary | ICD-10-CM

## 2022-09-02 ENCOUNTER — Ambulatory Visit (HOSPITAL_COMMUNITY)
Admission: RE | Admit: 2022-09-02 | Discharge: 2022-09-02 | Disposition: A | Discharge status: Home / Self Care | Payer: BC Managed Care – PPO | Source: Ambulatory Visit | Attending: Gastroenterology | Admitting: Gastroenterology

## 2022-09-02 DIAGNOSIS — D3A026 Benign carcinoid tumor of the rectum: Secondary | ICD-10-CM | POA: Diagnosis not present

## 2022-09-02 DIAGNOSIS — K5730 Diverticulosis of large intestine without perforation or abscess without bleeding: Secondary | ICD-10-CM | POA: Diagnosis not present

## 2022-09-02 DIAGNOSIS — K769 Liver disease, unspecified: Secondary | ICD-10-CM | POA: Diagnosis not present

## 2022-09-02 DIAGNOSIS — C7A026 Malignant carcinoid tumor of the rectum: Secondary | ICD-10-CM | POA: Diagnosis not present

## 2022-09-02 DIAGNOSIS — Z8603 Personal history of neoplasm of uncertain behavior: Secondary | ICD-10-CM | POA: Diagnosis not present

## 2022-09-02 MED ORDER — IOHEXOL 300 MG/ML  SOLN
100.0000 mL | Freq: Once | INTRAMUSCULAR | Status: AC | PRN
  Administered 2022-09-02: 100 mL via INTRAVENOUS

## 2022-09-03 ENCOUNTER — Ambulatory Visit (HOSPITAL_COMMUNITY): Payer: BC Managed Care – PPO

## 2022-09-07 ENCOUNTER — Encounter (HOSPITAL_COMMUNITY): Payer: Self-pay | Admitting: Gastroenterology

## 2022-09-13 NOTE — Anesthesia Preprocedure Evaluation (Addendum)
Anesthesia Evaluation  Patient identified by MRN, date of birth, ID band Patient awake    Reviewed: Allergy & Precautions, NPO status , Patient's Chart, lab work & pertinent test results  Airway Mallampati: II  TM Distance: >3 FB Neck ROM: Full    Dental no notable dental hx.    Pulmonary neg pulmonary ROS   Pulmonary exam normal        Cardiovascular negative cardio ROS  Rhythm:Regular Rate:Normal     Neuro/Psych negative neurological ROS  negative psych ROS   GI/Hepatic Neg liver ROS,,,Rectal carcinoid    Endo/Other  negative endocrine ROS    Renal/GU negative Renal ROS  negative genitourinary   Musculoskeletal   Abdominal Normal abdominal exam  (+)   Peds  Hematology  (+) Blood dyscrasia, anemia   Anesthesia Other Findings   Reproductive/Obstetrics                             Anesthesia Physical Anesthesia Plan  ASA: 2  Anesthesia Plan: MAC   Post-op Pain Management:    Induction: Intravenous  PONV Risk Score and Plan: 2 and Propofol infusion and Treatment may vary due to age or medical condition  Airway Management Planned: Simple Face Mask, Natural Airway and Nasal Cannula  Additional Equipment: None  Intra-op Plan:   Post-operative Plan:   Informed Consent: I have reviewed the patients History and Physical, chart, labs and discussed the procedure including the risks, benefits and alternatives for the proposed anesthesia with the patient or authorized representative who has indicated his/her understanding and acceptance.     Dental advisory given  Plan Discussed with: CRNA  Anesthesia Plan Comments:        Anesthesia Quick Evaluation

## 2022-09-14 ENCOUNTER — Encounter (HOSPITAL_COMMUNITY)
Admission: RE | Disposition: A | Discharge status: Home / Self Care | Payer: Self-pay | Source: Home / Self Care | Attending: Gastroenterology

## 2022-09-14 ENCOUNTER — Ambulatory Visit (HOSPITAL_COMMUNITY): Payer: BC Managed Care – PPO | Admitting: Anesthesiology

## 2022-09-14 ENCOUNTER — Encounter (HOSPITAL_COMMUNITY): Payer: Self-pay | Admitting: Gastroenterology

## 2022-09-14 ENCOUNTER — Ambulatory Visit (HOSPITAL_COMMUNITY)
Admission: RE | Admit: 2022-09-14 | Discharge: 2022-09-14 | Disposition: A | Discharge status: Home / Self Care | Payer: BC Managed Care – PPO | Attending: Gastroenterology | Admitting: Gastroenterology

## 2022-09-14 DIAGNOSIS — K641 Second degree hemorrhoids: Secondary | ICD-10-CM | POA: Diagnosis not present

## 2022-09-14 DIAGNOSIS — K6289 Other specified diseases of anus and rectum: Secondary | ICD-10-CM | POA: Diagnosis not present

## 2022-09-14 DIAGNOSIS — I899 Noninfective disorder of lymphatic vessels and lymph nodes, unspecified: Secondary | ICD-10-CM | POA: Diagnosis not present

## 2022-09-14 DIAGNOSIS — C7A026 Malignant carcinoid tumor of the rectum: Secondary | ICD-10-CM | POA: Diagnosis not present

## 2022-09-14 DIAGNOSIS — C7A1 Malignant poorly differentiated neuroendocrine tumors: Secondary | ICD-10-CM | POA: Diagnosis not present

## 2022-09-14 DIAGNOSIS — D3A026 Benign carcinoid tumor of the rectum: Secondary | ICD-10-CM

## 2022-09-14 DIAGNOSIS — D649 Anemia, unspecified: Secondary | ICD-10-CM | POA: Insufficient documentation

## 2022-09-14 HISTORY — PX: SUBMUCOSAL LIFTING INJECTION: SHX6855

## 2022-09-14 HISTORY — PX: HEMOSTASIS CLIP PLACEMENT: SHX6857

## 2022-09-14 HISTORY — PX: EUS: SHX5427

## 2022-09-14 HISTORY — PX: ENDOSCOPIC MUCOSAL RESECTION: SHX6839

## 2022-09-14 HISTORY — PX: FLEXIBLE SIGMOIDOSCOPY: SHX5431

## 2022-09-14 LAB — PREGNANCY, URINE: Preg Test, Ur: NEGATIVE

## 2022-09-14 SURGERY — ULTRASOUND, LOWER GI TRACT, ENDOSCOPIC
Anesthesia: Monitor Anesthesia Care

## 2022-09-14 MED ORDER — PHENYLEPHRINE HCL (PRESSORS) 10 MG/ML IV SOLN
INTRAVENOUS | Status: DC | PRN
  Administered 2022-09-14 (×3): 160 ug via INTRAVENOUS

## 2022-09-14 MED ORDER — SODIUM CHLORIDE 0.9 % IV SOLN: INTRAVENOUS | Status: DC

## 2022-09-14 MED ORDER — PROPOFOL 500 MG/50ML IV EMUL
INTRAVENOUS | Status: DC | PRN
  Administered 2022-09-14: 125 ug/kg/min via INTRAVENOUS

## 2022-09-14 MED ORDER — LACTATED RINGERS IV SOLN: INTRAVENOUS | Status: DC

## 2022-09-14 MED ORDER — PROPOFOL 10 MG/ML IV BOLUS
INTRAVENOUS | Status: DC | PRN
  Administered 2022-09-14 (×2): 20 mg via INTRAVENOUS

## 2022-09-14 NOTE — Transfer of Care (Signed)
Immediate Anesthesia Transfer of Care Note  Patient: Jaquia Benedicto  Procedure(s) Performed: LOWER ENDOSCOPIC ULTRASOUND (EUS) ENDOSCOPIC MUCOSAL RESECTION FLEXIBLE SIGMOIDOSCOPY SUBMUCOSAL LIFTING INJECTION HEMOSTASIS CLIP PLACEMENT  Patient Location: PACU  Anesthesia Type:MAC  Level of Consciousness: awake, alert , and oriented  Airway & Oxygen Therapy: Patient Spontanous Breathing and Patient connected to face mask oxygen  Post-op Assessment: Report given to RN and Post -op Vital signs reviewed and stable  Post vital signs: Reviewed and stable  Last Vitals:  Vitals Value Taken Time  BP 88/46 09/14/22 0938  Temp 36.7 C 09/14/22 0933  Pulse 64 09/14/22 0939  Resp 24 09/14/22 0939  SpO2 100 % 09/14/22 0939  Vitals shown include unvalidated device data.  Last Pain:  Vitals:   09/14/22 0933  TempSrc: Temporal  PainSc: 0-No pain         Complications: No notable events documented.

## 2022-09-14 NOTE — Anesthesia Postprocedure Evaluation (Signed)
Anesthesia Post Note  Patient: Jade Beck  Procedure(s) Performed: LOWER ENDOSCOPIC ULTRASOUND (EUS) ENDOSCOPIC MUCOSAL RESECTION FLEXIBLE SIGMOIDOSCOPY SUBMUCOSAL LIFTING INJECTION HEMOSTASIS CLIP PLACEMENT     Patient location during evaluation: Endoscopy Anesthesia Type: MAC Level of consciousness: awake and alert Pain management: pain level controlled Vital Signs Assessment: post-procedure vital signs reviewed and stable Respiratory status: spontaneous breathing, nonlabored ventilation, respiratory function stable and patient connected to nasal cannula oxygen Cardiovascular status: stable and blood pressure returned to baseline Postop Assessment: no apparent nausea or vomiting Anesthetic complications: no   No notable events documented.  Last Vitals:  Vitals:   09/14/22 0955 09/14/22 1000  BP: 112/67 95/69  Pulse: 71 74  Resp: (!) 28 19  Temp:    SpO2: 100% 100%    Last Pain:  Vitals:   09/14/22 1000  TempSrc:   PainSc: 0-No pain                 Belenda Cruise P Particia Strahm

## 2022-09-14 NOTE — H&P (Signed)
GASTROENTEROLOGY PROCEDURE H&P NOTE   Primary Care Physician: Velna Hatchet, MD  HPI: Jade Beck is a 41 y.o. 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  Past Medical History:  Diagnosis Date   Anemia    pregnancy induced   Past Surgical History:  Procedure Laterality Date   NASAL RECONSTRUCTION WITH SEPTAL REPAIR     WISDOM TOOTH EXTRACTION     Current Facility-Administered Medications  Medication Dose Route Frequency Provider Last Rate Last Admin   0.9 %  sodium chloride infusion   Intravenous Continuous Mansouraty, Telford Nab., MD       lactated ringers infusion   Intravenous Continuous Mansouraty, Telford Nab., MD 50 mL/hr at 09/14/22 0737 New Bag at 09/14/22 0737    Current Facility-Administered Medications:    0.9 %  sodium chloride infusion, , Intravenous, Continuous, Mansouraty, Telford Nab., MD   lactated ringers infusion, , Intravenous, Continuous, Mansouraty, Telford Nab., MD, Last Rate: 50 mL/hr at 09/14/22 0737, New Bag at 09/14/22 0737 No Known Allergies Family History  Problem Relation Age of Onset   Diabetes Father    Heart disease Brother    Social History   Socioeconomic History   Marital status: Married    Spouse name: Not on file   Number of children: 1   Years of education: Not on file   Highest education level: Not on file  Occupational History   Occupation: self employed  Tobacco Use   Smoking status: Never   Smokeless tobacco: Never  Substance and Sexual Activity   Alcohol use: No   Drug use: No   Sexual activity: Yes  Other Topics Concern   Not on file  Social History Narrative   Not on file   Social Determinants of Health   Financial Resource Strain: Not on file  Food Insecurity: Not on file  Transportation Needs: Not on file  Physical Activity: Not on file  Stress: Not on file  Social Connections: Not on file  Intimate Partner Violence: Not on file    Physical Exam: Today's Vitals    09/14/22 0728  BP: 118/71  Pulse: (!) 114  Resp: 16  Temp: 97.6 F (36.4 C)  TempSrc: Temporal  SpO2: 100%  Weight: 64.4 kg  Height: '5\' 9"'$  (1.753 m)  PainSc: 0-No pain   Body mass index is 20.97 kg/m. GEN: NAD EYE: Sclerae anicteric ENT: MMM CV: Non-tachycardic GI: Soft, NT/ND NEURO:  Alert & Oriented x 3  Lab Results: No results for input(s): "WBC", "HGB", "HCT", "PLT" in the last 72 hours. BMET No results for input(s): "NA", "K", "CL", "CO2", "GLUCOSE", "BUN", "CREATININE", "CALCIUM" in the last 72 hours. LFT No results for input(s): "PROT", "ALBUMIN", "AST", "ALT", "ALKPHOS", "BILITOT", "BILIDIR", "IBILI" in the last 72 hours. PT/INR No results for input(s): "LABPROT", "INR" in the last 72 hours.   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  The risks of an EUS including intestinal perforation, bleeding, infection, aspiration, and medication effects were discussed as was the possibility it may not give a definitive diagnosis if a biopsy is performed.    The risks and benefits of endoscopic evaluation/treatment were discussed with the patient and/or family; these include but are not limited to the risk of perforation, infection, bleeding, missed lesions, lack of diagnosis, severe illness requiring hospitalization, as well as anesthesia and sedation related illnesses.  The patient's history has been  reviewed, patient examined, no change in status, and deemed stable for procedure.  The patient and/or family is agreeable to proceed.    Justice Britain, MD [facility] Office # [phone]

## 2022-09-14 NOTE — Op Note (Signed)
[facility] Patient Name: Jade Beck Procedure Date: 09/14/2022 MRN: [id number] Attending MD: Justice Britain , MD, [id number] Date of Birth: 09/15/1981 CSN: [id number] Age: 41 Admit Type: Outpatient Procedure:                Lower EUS Indications:              Rectal deformity found on endoscopy; subepithelial                            tumor versus extrinsic compression, Neuroendocrine                            Tumor Providers:                Justice Britain, MD, Burtis Junes, RN, Darliss Cheney,                            Technician, Herbie Drape, CRNA Referring MD:             Estill Cotta. Loletha Carrow, MD, Velna Hatchet Medicines:                Monitored Anesthesia Care Complications:            No immediate complications. Estimated Blood Loss:     Estimated blood loss was minimal. Procedure:                Pre-Anesthesia Assessment:                           - Prior to the procedure, a History and Physical                            was performed, and patient medications and                            allergies were reviewed. The patient's tolerance of                            previous anesthesia was also reviewed. The risks                            and benefits of the procedure and the sedation                            options and risks were discussed with the patient.                            All questions were answered, and informed consent                            was obtained. Prior Anticoagulants: The patient has                            taken no anticoagulant or antiplatelet agents. ASA  Grade Assessment: II - A patient with mild systemic                            disease. After reviewing the risks and benefits,                            the patient was deemed in satisfactory condition to                            undergo the procedure.                           After obtaining informed consent, the endoscope was                             passed under direct vision. Throughout the                            procedure, the patient's blood pressure, pulse, and                            oxygen saturations were monitored continuously. The                            [id number] ([id number]) Olympus therapeutic endoscope                            was introduced through the anus and advanced to the                            the sigmoid colon. The [id number] ([id number])                            Olympus radial ultrasound scope was introduced                            through the anus and advanced to the the sigmoid                            colon for ultrasound. The lower EUS was                            accomplished without difficulty. The patient                            tolerated the procedure. The quality of the bowel                            preparation was adequate. Scope In: 8:59:30 AM Scope Out: 9:27:54 AM Total Procedure Duration: 0 hours 28 minutes 24 seconds  Findings:      The digital rectal exam findings include palpable rectal nodule and       hemorrhoids.      ENDOSCOPIC FINDING: :      One approximately 10  mm subepithelial nodule was found in the distal       rectum (approximately 5 to 6 cm from anal os) ?" previously biopsied as       neuroendocrine tumor. After the EUS was completed, preparations were       made for mucosal resection. Demarcation of the lesion was performed with       high-definition white light and narrow band imaging to clearly identify       the boundaries of the lesion. EverLift was injected to raise the lesion.       Band ligator and snare mucosal resection was performed. Resection and       retrieval were complete. Resected tissue margins were examined and clear       of polyp tissue. To prevent bleeding after mucosal resection, four       hemostatic clips were successfully placed (MR conditional). Clip       manufacturer: [employer]. There was no bleeding  during, or at the       end, of the procedure.      Normal mucosa was found otherwise in the rectum, in the recto-sigmoid       colon and in the sigmoid colon.      Non-bleeding non-thrombosed internal hemorrhoids were found during       retroflexion, during perianal exam and during digital exam. The       hemorrhoids were Grade II (internal hemorrhoids that prolapse but reduce       spontaneously).      ENDOSONOGRAPHIC FINDING: :      An oval intramural (subepithelial) lesion was found in the rectum. The       lesion was encountered at 5 cm (from the anal verge). The lesion was       hypoechoic. Sonographically, the origin appeared to be within the deep       mucosa (Layer 2). No additional wall layers were involved. The lesion       measured up to 10.2 mm by 6.2 mm in thickness. The endosonographic       borders were well-defined.      The rectum was otherwise normal.      No malignant-appearing lymph nodes were visualized in the perirectal       region and in the left iliac region. The nodes were.      The rectosigmoid junction and sigmoid colon were normal.      The internal anal sphincter was visualized endosonographically and       appeared normal. Impression:               FLEX Impression:                           - Palpable rectal nodule and hemorrhoids found on                            digital rectal exam.                           - Subepithelial nodule in the distal rectum. After                            EUS, complete removal was accomplished via  CAP/BAND/SNARE mucosal resection. Clips (MR                            conditional) were placed. Clip manufacturer: [employer].                           - Normal mucosa otherwise in the rectum, in the                            recto-sigmoid colon and in the sigmoid colon.                           - Non-bleeding non-thrombosed internal hemorrhoids.                            EUS Impression:                           - An intramural (subepithelial) lesion was                            visualized endosonographically in the rectum. The                            origin of the lesion appeared to be within the deep                            mucosa (Layer 2). A tissue diagnosis was obtained                            prior to this exam. This is consistent with                            neuroendocrine tumor/carcinoid.                           - Endosonographic images of the rectum were                            unremarkable.                           - No malignant-appearing lymph nodes were                            visualized endosonographically in the perirectal                            region and in the left iliac region.                           - Endosonographic imaging showed no sign of  significant pathology at the rectosigmoid junction                            and in the sigmoid colon.                           - The internal anal sphincter was visualized                            endosonographically and appeared normal. Moderate Sedation:      Not Applicable - Patient had care per Anesthesia. Recommendation:           - The patient will be observed post-procedure,                            until all discharge criteria are met.                           - Discharge patient to home.                           - Patient has a contact number available for                            emergencies. The signs and symptoms of potential                            delayed complications were discussed with the                            patient. Return to normal activities tomorrow.                            Written discharge instructions were provided to the                            patient.                           - High fiber diet.                           - Await path results.                           - Repeat  lower endoscopic ultrasound in 1 year for                            surveillance (pending final pathology).                           - The findings and recommendations were discussed                            with the patient.                           -  The findings and recommendations were discussed                            with the patient's family. Procedure Code(s):        --- Professional ---                           [phone], Sigmoidoscopy, flexible; with endoscopic                            mucosal resection                           68127, Sigmoidoscopy, flexible; with endoscopic                            ultrasound examination Diagnosis Code(s):        --- Professional ---                           K62.89, Other specified diseases of anus and rectum                           K64.1, Second degree hemorrhoids                           I89.9, Noninfective disorder of lymphatic vessels                            and lymph nodes, unspecified CPT copyright 2022 American Medical Association. All rights reserved. The codes documented in this report are preliminary and upon coder review may  be revised to meet current compliance requirements. Justice Britain, MD 09/14/2022 9:47:32 AM Number of Addenda: 0

## 2022-09-14 NOTE — Discharge Instructions (Signed)
YOU HAD AN ENDOSCOPIC PROCEDURE TODAY: Refer to the procedure report and other information in the discharge instructions given to you for any specific questions about what was found during the examination. If this information does not answer your questions, please call [facility] office at [phone] to clarify.  ° °YOU SHOULD EXPECT: Some feelings of bloating in the abdomen. Passage of more gas than usual. Walking can help get rid of the air that was put into your GI tract during the procedure and reduce the bloating. If you had a lower endoscopy (such as a colonoscopy or flexible sigmoidoscopy) you may notice spotting of blood in your stool or on the toilet paper. Some abdominal soreness may be present for a day or two, also. ° °DIET: Your first meal following the procedure should be a light meal and then it is ok to progress to your normal diet. A half-sandwich or bowl of soup is an example of a good first meal. Heavy or fried foods are harder to digest and may make you feel nauseous or bloated. Drink plenty of fluids but you should avoid alcoholic beverages for 24 hours. If you had a esophageal dilation, please see attached instructions for diet.   ° °ACTIVITY: Your care partner should take you home directly after the procedure. You should plan to take it easy, moving slowly for the rest of the day. You can resume normal activity the day after the procedure however YOU SHOULD NOT DRIVE, use power tools, machinery or perform tasks that involve climbing or major physical exertion for 24 hours (because of the sedation medicines used during the test).  ° °SYMPTOMS TO REPORT IMMEDIATELY: °A gastroenterologist can be reached at any hour. Please call [phone]  for any of the following symptoms:  °Following lower endoscopy (colonoscopy, flexible sigmoidoscopy) °Excessive amounts of blood in the stool  °Significant tenderness, worsening of abdominal pains  °Swelling of the abdomen that is new, acute  °Fever of 100° or  higher  °Following upper endoscopy (EGD, EUS, ERCP, esophageal dilation) °Vomiting of blood or coffee ground material  °New, significant abdominal pain  °New, significant chest pain or pain under the shoulder blades  °Painful or persistently difficult swallowing  °New shortness of breath  °Black, tarry-looking or red, bloody stools ° °FOLLOW UP:  °If any biopsies were taken you will be contacted by phone or by letter within the next 1-3 weeks. Call [phone]  if you have not heard about the biopsies in 3 weeks.  °Please also call with any specific questions about appointments or follow up tests. ° °

## 2022-09-17 ENCOUNTER — Encounter: Payer: Self-pay | Admitting: Gastroenterology

## 2022-09-17 LAB — SURGICAL PATHOLOGY

## 2022-09-21 ENCOUNTER — Encounter (HOSPITAL_COMMUNITY): Payer: Self-pay | Admitting: Gastroenterology

## 2022-11-11 DIAGNOSIS — R7989 Other specified abnormal findings of blood chemistry: Secondary | ICD-10-CM | POA: Diagnosis not present

## 2022-11-18 DIAGNOSIS — D3A026 Benign carcinoid tumor of the rectum: Secondary | ICD-10-CM | POA: Diagnosis not present

## 2022-11-18 DIAGNOSIS — Z0000 Encounter for general adult medical examination without abnormal findings: Secondary | ICD-10-CM | POA: Diagnosis not present

## 2022-11-18 DIAGNOSIS — Z1339 Encounter for screening examination for other mental health and behavioral disorders: Secondary | ICD-10-CM | POA: Diagnosis not present

## 2022-11-18 DIAGNOSIS — I712 Thoracic aortic aneurysm, without rupture, unspecified: Secondary | ICD-10-CM | POA: Diagnosis not present

## 2022-11-18 DIAGNOSIS — Z1331 Encounter for screening for depression: Secondary | ICD-10-CM | POA: Diagnosis not present

## 2023-02-04 DIAGNOSIS — R002 Palpitations: Secondary | ICD-10-CM | POA: Diagnosis not present

## 2023-02-04 DIAGNOSIS — I77810 Thoracic aortic ectasia: Secondary | ICD-10-CM | POA: Diagnosis not present

## 2023-02-04 DIAGNOSIS — R0600 Dyspnea, unspecified: Secondary | ICD-10-CM | POA: Diagnosis not present

## 2023-08-02 DIAGNOSIS — Z01419 Encounter for gynecological examination (general) (routine) without abnormal findings: Secondary | ICD-10-CM | POA: Diagnosis not present

## 2023-08-02 DIAGNOSIS — Z1231 Encounter for screening mammogram for malignant neoplasm of breast: Secondary | ICD-10-CM | POA: Diagnosis not present

## 2023-08-02 DIAGNOSIS — Z1331 Encounter for screening for depression: Secondary | ICD-10-CM | POA: Diagnosis not present

## 2023-08-24 ENCOUNTER — Encounter: Payer: Self-pay | Admitting: Gastroenterology

## 2023-08-24 ENCOUNTER — Ambulatory Visit: Payer: BC Managed Care – PPO | Admitting: Gastroenterology

## 2023-08-24 VITALS — BP 110/70 | HR 100 | Ht 69.0 in

## 2023-08-24 DIAGNOSIS — Z860100 Personal history of colon polyps, unspecified: Secondary | ICD-10-CM | POA: Diagnosis not present

## 2023-08-24 DIAGNOSIS — D3A026 Benign carcinoid tumor of the rectum: Secondary | ICD-10-CM | POA: Diagnosis not present

## 2023-08-24 DIAGNOSIS — K5909 Other constipation: Secondary | ICD-10-CM

## 2023-08-24 NOTE — Progress Notes (Signed)
[facility] Progress Note  Chief Complaint: Chronic constipation  Subjective  Prior history  Office consultation October 2024 for years of constipation, occurring approximately since birth of her first child and progressively worsening over time.  Suspicion for possible pelvic floor dysfunction with a history of difficult delivery and perineal injury. Colonoscopy November 2023-report reviewed.  She seemed to have normal sphincter tone and function on presedation DRE. Subcentimeter rectal tubular adenoma removed. More distal rectal carcinoid found.  She underwent lower EUS and EMR of that lesion with Dr. 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  He recommended a repeat rectal ultrasound at 1 year.  ______________________   Jade Beck is still troubled by constipation, but says it is significantly improved from when I saw her last year.  She began a high-protein low carb diet, stays as active as possible, and started magnesium  supplement at bedtime..  The latter has also improved her sleep as an added benefit.  She is using a Training and development officer, and with all that is having a BM about every other day.  Has not needed laxatives, would rather not take any prescription meds if possible.  She still has some bulging of the perineum and puts pressure on that aid with bowel movements.  Has not needed disimpaction.  ROS: Cardiovascular:  no chest pain Respiratory: no dyspnea  The patient's Past Medical, Family and Social History were reviewed and are on file in the EMR. Past Medical History:  Diagnosis Date   Anemia    pregnancy induced    Past Surgical History:  Procedure Laterality Date   ENDOSCOPIC MUCOSAL RESECTION N/A 09/14/2022   Procedure: ENDOSCOPIC MUCOSAL RESECTION;  Surgeon: Jade Beck Jade Beck., MD;  Location: [facility];  Service: Gastroenterology;  Laterality: N/A;   EUS N/A 09/14/2022   Procedure: LOWER ENDOSCOPIC  ULTRASOUND (EUS);  Surgeon: Jade Beck., MD;  Location: [facility];  Service: Gastroenterology;  Laterality: N/A;   FLEXIBLE SIGMOIDOSCOPY N/A 09/14/2022   Procedure: FLEXIBLE SIGMOIDOSCOPY;  Surgeon: Jade Beck Jade Beck., MD;  Location: [facility];  Service: Gastroenterology;  Laterality: N/A;   HEMOSTASIS CLIP PLACEMENT  09/14/2022   Procedure: HEMOSTASIS CLIP PLACEMENT;  Surgeon: Jade Beck Jade Beck., MD;  Location: [facility];  Service: Gastroenterology;;   NASAL RECONSTRUCTION WITH SEPTAL REPAIR     SUBMUCOSAL LIFTING INJECTION  09/14/2022   Procedure: SUBMUCOSAL LIFTING INJECTION;  Surgeon: Jade Beck., MD;  Location: [facility];  Service: Gastroenterology;;   WISDOM TOOTH EXTRACTION       Objective:  Med list reviewed  Current Outpatient Medications:    ascorbic acid (VITAMIN C) 500 MG tablet, Take 500 mg by mouth daily., Disp: , Rfl:    cholecalciferol (VITAMIN D3) 25 MCG (1000 UNIT) tablet, Take 1,000 Units by mouth daily., Disp: , Rfl:    cyanocobalamin (VITAMIN B12) 1000 MCG tablet, Take 1,000 mcg by mouth daily., Disp: , Rfl:    magnesium oxide (MAG-OX) 400 (240 Mg) MG tablet, Take 400 mg by mouth at bedtime., Disp: , Rfl:    Omega-3 Fatty Acids (FISH OIL) 1000 MG CAPS, Take 1,000 mg by mouth daily., Disp: , Rfl:    zinc gluconate 50 MG tablet, Take 50 mg by mouth daily., Disp: , Rfl:    Vital signs in last 24 hrs: Vitals:   08/24/23 1319  BP: 110/70  Pulse: 100   Wt Readings from Last 3 Encounters:  09/14/22 142 lb (64.4 kg)  08/12/22 152 lb (68.9 kg)  07/22/22  152 lb (68.9 kg)    Physical Exam  Looks well.  No additional exam, entire visit spent in review of previous findings, current issues and plan.   Labs:   ___________________________________________ Radiologic studies:   ____________________________________________ Other:   _____________________________________________   Encounter Diagnoses  Name Primary?    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  She does see gynecology regularly, and I asked her to bring that up with them when she next has a routine visit.  I am not aware that a peroneal muscle laxity is amenable to any surgical therapy, but it would not hurt to ask. She will continue her current regimen this seems to be working well.  Low-dose MiraLAX can be taken as needed as well.  She is due for a surveillance EUS with Dr. Meridee Beck, so I will forward the chart to him since he is usually booking out a couple months for these procedures.  I reassured her that would be okay given that this carcinoid was small with negative margins.  I have also set a December 2028 recall for full colonoscopy (5 years from initial exam).    20 minutes were spent on this encounter (including chart review, history/exam, counseling/coordination of care, and documentation) > 50% of that time was spent on counseling and coordination of care.   Jade Beck III

## 2023-08-24 NOTE — Patient Instructions (Signed)
   _______________________________________________________  If your blood pressure at your visit was 140/90 or greater, please contact your primary care physician to follow up on this.  _______________________________________________________  If you are age 44 or older, your body mass index should be between 23-30. Your Body mass index is 20.97 kg/m. If this is out of the aforementioned range listed, please consider follow up with your Primary Care Provider.  If you are age 52 or younger, your body mass index should be between 19-25. Your Body mass index is 20.97 kg/m. If this is out of the aformentioned range listed, please consider follow up with your Primary Care Provider.   ________________________________________________________  The [facility] providers would like to encourage you to use [facility] to communicate with providers for non-urgent requests or questions.  Due to long hold times on the telephone, sending your provider a message by [facility] may be a faster and more efficient way to get a response.  Please allow 48 business hours for a response.  Please remember that this is for non-urgent requests.  _______________________________________________________ It was a pleasure to see you today!  Thank you for trusting me with your gastrointestinal care!

## 2023-08-25 ENCOUNTER — Telehealth: Payer: Self-pay

## 2023-08-25 NOTE — Telephone Encounter (Signed)
-----   Message from [facility] sent at 08/24/2023  5:05 PM EST ----- HD, Sounds good. GM ----- Message ----- From: Sherrilyn Rist, MD Sent: 08/24/2023   1:52 PM EST To: Loretha Stapler, RN; Lemar Lofty., MD  Liz Beach,  You were good enough to do a rectal EUS and EMR of a carcinoid with a negative margin on this patient 11 months ago.  She is due for a 1 year repeat EUS per your recommendation.  I have told her it is likely to be January or February with her current schedule, but I think that would be okay.  She is also amenable to being on your cancellation list, believing she can make herself available on short notice if there is an unexpected availability. Thanks so much  (I copied Thalya Fouche on this)  H Danis

## 2023-08-31 ENCOUNTER — Other Ambulatory Visit: Payer: Self-pay

## 2023-08-31 DIAGNOSIS — D3A026 Benign carcinoid tumor of the rectum: Secondary | ICD-10-CM

## 2023-08-31 NOTE — Telephone Encounter (Signed)
EUS scheduled, pt instructed and medications reviewed.  Patient instructions mailed to home.  Patient to call with any questions or concerns.  

## 2023-08-31 NOTE — Telephone Encounter (Signed)
Lower EUS EMR set up for 11/18/23 at 1145 am at [facility] with GM   Left message on machine to call back

## 2023-11-03 DIAGNOSIS — M62830 Muscle spasm of back: Secondary | ICD-10-CM | POA: Diagnosis not present

## 2023-11-03 DIAGNOSIS — M545 Low back pain, unspecified: Secondary | ICD-10-CM | POA: Diagnosis not present

## 2023-11-11 ENCOUNTER — Encounter (HOSPITAL_COMMUNITY): Payer: Self-pay | Admitting: Gastroenterology

## 2023-11-11 NOTE — Progress Notes (Signed)
Attempted to obtain medical history for pre op call via telephone, unable to reach at this time. HIPAA compliant voicemail message left requesting return call to pre surgical testing department.

## 2023-11-18 ENCOUNTER — Ambulatory Visit (HOSPITAL_COMMUNITY)
Admission: RE | Admit: 2023-11-18 | Discharge: 2023-11-18 | Disposition: A | Discharge status: Home / Self Care | Payer: BC Managed Care – PPO | Attending: Gastroenterology | Admitting: Gastroenterology

## 2023-11-18 ENCOUNTER — Encounter (HOSPITAL_COMMUNITY)
Admission: RE | Disposition: A | Discharge status: Home / Self Care | Payer: Self-pay | Source: Home / Self Care | Attending: Gastroenterology

## 2023-11-18 ENCOUNTER — Telehealth: Payer: Self-pay

## 2023-11-18 ENCOUNTER — Ambulatory Visit (HOSPITAL_COMMUNITY): Payer: BC Managed Care – PPO | Admitting: Anesthesiology

## 2023-11-18 ENCOUNTER — Encounter (HOSPITAL_COMMUNITY): Payer: Self-pay | Admitting: Gastroenterology

## 2023-11-18 ENCOUNTER — Other Ambulatory Visit: Payer: Self-pay

## 2023-11-18 DIAGNOSIS — D3A026 Benign carcinoid tumor of the rectum: Secondary | ICD-10-CM

## 2023-11-18 DIAGNOSIS — L905 Scar conditions and fibrosis of skin: Secondary | ICD-10-CM | POA: Insufficient documentation

## 2023-11-18 DIAGNOSIS — Z8603 Personal history of neoplasm of uncertain behavior: Secondary | ICD-10-CM | POA: Diagnosis not present

## 2023-11-18 DIAGNOSIS — Q438 Other specified congenital malformations of intestine: Secondary | ICD-10-CM | POA: Diagnosis not present

## 2023-11-18 DIAGNOSIS — K640 First degree hemorrhoids: Secondary | ICD-10-CM | POA: Insufficient documentation

## 2023-11-18 DIAGNOSIS — Z9889 Other specified postprocedural states: Secondary | ICD-10-CM | POA: Diagnosis not present

## 2023-11-18 DIAGNOSIS — K629 Disease of anus and rectum, unspecified: Secondary | ICD-10-CM | POA: Diagnosis not present

## 2023-11-18 HISTORY — PX: FLEXIBLE SIGMOIDOSCOPY: SHX5431

## 2023-11-18 HISTORY — PX: EUS: SHX5427

## 2023-11-18 SURGERY — ULTRASOUND, LOWER GI TRACT, ENDOSCOPIC
Anesthesia: Monitor Anesthesia Care

## 2023-11-18 MED ORDER — PROPOFOL 500 MG/50ML IV EMUL
INTRAVENOUS | Status: AC
  Filled 2023-11-18: qty 50

## 2023-11-18 MED ORDER — PROPOFOL 500 MG/50ML IV EMUL
INTRAVENOUS | Status: DC | PRN
Start: 2023-11-18 — End: 2023-11-18
  Administered 2023-11-18: 200 ug/kg/min via INTRAVENOUS

## 2023-11-18 MED ORDER — KETOROLAC TROMETHAMINE 0.5 % OP SOLN
1.0000 [drp] | Freq: Four times a day (QID) | OPHTHALMIC | Status: DC
  Administered 2023-11-18: 1 [drp] via OPHTHALMIC
  Filled 2023-11-18: qty 5

## 2023-11-18 MED ORDER — SODIUM CHLORIDE 0.9 % IV SOLN: INTRAVENOUS | Status: DC

## 2023-11-18 MED ORDER — MIDAZOLAM HCL 2 MG/2ML IJ SOLN
INTRAMUSCULAR | Status: AC
  Filled 2023-11-18: qty 2

## 2023-11-18 MED ORDER — POLYMYXIN B-TRIMETHOPRIM 10000-0.1 UNIT/ML-% OP SOLN
1.0000 [drp] | Freq: Four times a day (QID) | OPHTHALMIC | Status: DC
  Administered 2023-11-18: 1 [drp] via OPHTHALMIC
  Filled 2023-11-18: qty 10

## 2023-11-18 MED ORDER — LIDOCAINE 2% (20 MG/ML) 5 ML SYRINGE
INTRAMUSCULAR | Status: DC | PRN
  Administered 2023-11-18: 60 mg via INTRAVENOUS

## 2023-11-18 MED ORDER — PROPOFOL 10 MG/ML IV BOLUS
INTRAVENOUS | Status: DC | PRN
  Administered 2023-11-18: 40 mg via INTRAVENOUS

## 2023-11-18 MED ORDER — MIDAZOLAM HCL 2 MG/2ML IJ SOLN
INTRAMUSCULAR | Status: DC | PRN
Start: 2023-11-18 — End: 2023-11-18
  Administered 2023-11-18: 2 mg via INTRAVENOUS

## 2023-11-18 MED ORDER — BSS IO SOLN
15.0000 mL | Freq: Once | INTRAOCULAR | Status: AC
  Administered 2023-11-18: 15 mL
  Filled 2023-11-18: qty 15

## 2023-11-18 NOTE — Telephone Encounter (Signed)
The pt has been advised and agrees to come in for fasting labs.

## 2023-11-18 NOTE — Telephone Encounter (Signed)
Lab has been entered as ordered  Recall entered as ordered   No answer voice mail full

## 2023-11-18 NOTE — Discharge Instructions (Signed)
YOU HAD AN ENDOSCOPIC PROCEDURE TODAY: Refer to the procedure report and other information in the discharge instructions given to you for any specific questions about what was found during the examination. If this information does not answer your questions, please call  office at [phone] to clarify.  ° °YOU SHOULD EXPECT: Some feelings of bloating in the abdomen. Passage of more gas than usual. Walking can help get rid of the air that was put into your GI tract during the procedure and reduce the bloating. If you had a lower endoscopy (such as a colonoscopy or flexible sigmoidoscopy) you may notice spotting of blood in your stool or on the toilet paper. Some abdominal soreness may be present for a day or two, also. ° °DIET: Your first meal following the procedure should be a light meal and then it is ok to progress to your normal diet. A half-sandwich or bowl of soup is an example of a good first meal. Heavy or fried foods are harder to digest and may make you feel nauseous or bloated. Drink plenty of fluids but you should avoid alcoholic beverages for 24 hours. If you had a esophageal dilation, please see attached instructions for diet.   ° °ACTIVITY: Your care partner should take you home directly after the procedure. You should plan to take it easy, moving slowly for the rest of the day. You can resume normal activity the day after the procedure however YOU SHOULD NOT DRIVE, use power tools, machinery or perform tasks that involve climbing or major physical exertion for 24 hours (because of the sedation medicines used during the test).  ° °SYMPTOMS TO REPORT IMMEDIATELY: °A gastroenterologist can be reached at any hour. Please call [phone]  for any of the following symptoms:  °Following lower endoscopy (colonoscopy, flexible sigmoidoscopy) °Excessive amounts of blood in the stool  °Significant tenderness, worsening of abdominal pains  °Swelling of the abdomen that is new, acute  °Fever of 100° or  higher  °Following upper endoscopy (EGD, EUS, ERCP, esophageal dilation) °Vomiting of blood or coffee ground material  °New, significant abdominal pain  °New, significant chest pain or pain under the shoulder blades  °Painful or persistently difficult swallowing  °New shortness of breath  °Black, tarry-looking or red, bloody stools ° °FOLLOW UP:  °If any biopsies were taken you will be contacted by phone or by letter within the next 1-3 weeks. Call [phone]  if you have not heard about the biopsies in 3 weeks.  °Please also call with any specific questions about appointments or follow up tests. ° °

## 2023-11-18 NOTE — Op Note (Addendum)
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 Myrtie Neither, MD Medicines:                Monitored Anesthesia Care Complications:            No immediate complications. Estimated Blood Loss:     Estimated blood loss was minimal. Procedure:                Pre-Anesthesia Assessment:                           - Prior to the procedure, a History and Physical                            was performed, and patient medications and                            allergies were reviewed. The patient's tolerance of                            previous anesthesia was also reviewed. The risks                            and benefits of the procedure and the sedation                            options and risks were discussed with the patient.                            All questions were answered, and informed consent                            was obtained. Prior Anticoagulants: The patient has                            taken  no anticoagulant or antiplatelet agents. ASA                            Grade Assessment: II - A patient with mild systemic                            disease. After reviewing the risks and benefits,                            the patient was deemed in satisfactory condition to                            undergo the procedure.                           After obtaining  informed consent, the endoscope was                            passed under direct vision. Throughout the                            procedure, the patient's blood pressure, pulse, and                            oxygen saturations were monitored continuously. The                            [id number] ([id number]) Olympus therapeutic endoscope                            was introduced through the anus and advanced to the                            the descending colon. The [id number] ([id number])                            Olympus radial ultrasound scope was introduced                            through the anus and advanced to the the sigmoid                            colon for ultrasound. The lower EUS was                            accomplished without difficulty. The patient                            tolerated the procedure. The quality of the bowel                            preparation was adequate. Scope In: 11:05:42 AM Scope Out: 11:23:19 AM Total Procedure Duration: 0 hours 17 minutes 37 seconds  Findings:  The digital rectal exam was normal. Pertinent negatives include no       palpable rectal lesions.      ENDOSCOPIC FINDING: :      A medium post mucosectomy scar was found in the distal rectum. The scar       tissue was healthy in appearance.      The left colon was mildly tortuous.      Normal mucosa was found in the rectum, in the recto-sigmoid colon, in       the sigmoid colon and in the descending colon.      Non-bleeding non-thrombosed internal hemorrhoids were found during       retroflexion, during perianal exam and during digital exam. The       hemorrhoids were Grade I (internal hemorrhoids that do not prolapse).      ENDOSONOGRAPHIC FINDING: :      The rectum was normal.      The perirectal space was normal.      No malignant-appearing lymph nodes were visualized in the perirectal       region and in the left iliac region. The nodes were.      The internal anal  sphincter was visualized endosonographically and       appeared normal. Impression:               FLEX Impression:                           - Post mucosectomy scar in the distal rectum.                           - Tortuous left colon.                           - Normal mucosa in the rectum, in the recto-sigmoid                            colon, in the sigmoid colon and in the descending                            colon.                           - Non-bleeding non-thrombosed internal hemorrhoids.                           EUS Impression:                           - Endosonographic images of the rectum were                            unremarkable.                           - Endosonographic images of the perirectal space                            were unremarkable.                           -  No malignant-appearing lymph nodes were                            visualized endosonographically in the perirectal                            region and in the left iliac region.                           - The internal anal sphincter was visualized                            endosonographically and appeared normal. Moderate Sedation:      Not Applicable - Patient had care per Anesthesia. Recommendation:           - The patient will be observed post-procedure,                            until all discharge criteria are met.                           - Discharge patient to home.                           - Patient has a contact number available for                            emergencies. The signs and symptoms of potential                            delayed complications were discussed with the                            patient. Return to normal activities tomorrow.                            Written discharge instructions were provided to the                            patient.                           - Resume previous diet.                           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                           - Plan for repeat Chromogranin A level to be                            obtained now that she is post resection and it has  been time away. If chromogranin a level is                            elevated, query dotatate Netspot to rule out other                            areas/etiology for elevation of chromogranin A and                            make sure no evidence of other NETs. Will discuss                            with patient's primary gastroenterologist.                           - The findings and recommendations were discussed                            with the patient.                           - The findings and recommendations were discussed                            with the patient's family. Procedure Code(s):        --- Professional ---                           [phone], Sigmoidoscopy, flexible; with endoscopic                            ultrasound examination Diagnosis Code(s):        --- Professional ---                           [id number], Other specified postprocedural states                           K64.0, First degree hemorrhoids                           K62.89, Other specified diseases of anus and rectum                           Q43.8, Other specified congenital malformations of                            intestine CPT copyright 2022 [employer]. All rights reserved. The codes documented in this report are preliminary and upon coder review may  be revised to meet current compliance requirements. Corliss Parish, MD 11/18/2023 11:40:50 AM Number of Addenda: 0

## 2023-11-18 NOTE — Telephone Encounter (Signed)
-----   Message from [facility] sent at 11/18/2023 12:10 PM EST ----- Regarding: Followup HD, Rectal NET mucosectomy site looks good.  Would normally plan 2-year follow-up lower EUS. She did have an elevated chromogranin A in the past.  Probably makes sense for [place] to repeat it now that she is post resection and see if still elevated.  If elevated will need Netspot dotatate.  Jade Beck, Please have this patient come in for a chromogranin a fasting lab under Dr. Irving Burton name.  She was aware that we may want to have this done. 2-year lower EUS recall with me. Thanks. GM

## 2023-11-18 NOTE — Addendum Note (Signed)
Addendum  created 11/18/23 1222 by Linton Rump, MD   Clinical Note Signed

## 2023-11-18 NOTE — Anesthesia Preprocedure Evaluation (Addendum)
Anesthesia Evaluation  Patient identified by MRN, date of birth, ID band Patient awake    Reviewed: Allergy & Precautions, NPO status , Patient's Chart, lab work & pertinent test results  History of Anesthesia Complications Negative for: history of anesthetic complications  Airway Mallampati: II  TM Distance: >3 FB Neck ROM: Full    Dental  (+) Dental Advisory Given   Pulmonary neg pulmonary ROS   Pulmonary exam normal breath sounds clear to auscultation       Cardiovascular negative cardio ROS  Rhythm:Regular Rate:Normal     Neuro/Psych negative neurological ROS     GI/Hepatic Neg liver ROS,neg GERD  ,,Rectal carcinoid   Endo/Other  negative endocrine ROS    Renal/GU negative Renal ROS     Musculoskeletal   Abdominal   Peds  Hematology  (+) Blood dyscrasia, anemia   Anesthesia Other Findings   Reproductive/Obstetrics                              Anesthesia Physical Anesthesia Plan  ASA: 2  Anesthesia Plan: MAC   Post-op Pain Management: Minimal or no pain anticipated   Induction: Intravenous  PONV Risk Score and Plan: 2 and Propofol infusion, TIVA and Treatment may vary due to age or medical condition  Airway Management Planned: Natural Airway and Nasal Cannula  Additional Equipment:   Intra-op Plan:   Post-operative Plan:   Informed Consent: I have reviewed the patients History and Physical, chart, labs and discussed the procedure including the risks, benefits and alternatives for the proposed anesthesia with the patient or authorized representative who has indicated his/her understanding and acceptance.     Dental advisory given  Plan Discussed with: CRNA and Anesthesiologist  Anesthesia Plan Comments: (I have discussed risks of neuraxial anesthesia including but not limited to infection, bleeding, nerve injury, back pain, headache, seizures, and failure of block.  Patient denies bleeding disorders and is not currently anticoagulated. Labs have been reviewed. Risks and benefits discussed. All patient's questions answered.  )         Anesthesia Quick Evaluation

## 2023-11-18 NOTE — Anesthesia Procedure Notes (Signed)
Procedure Name: MAC Date/Time: 11/18/2023 11:01 AM  Performed by: Vanessa White, CRNAPre-anesthesia Checklist: Patient identified, Emergency Drugs available, Suction available and Patient being monitored Patient Re-evaluated:Patient Re-evaluated prior to induction Oxygen Delivery Method: Nasal cannula

## 2023-11-18 NOTE — H&P (Signed)
GASTROENTEROLOGY PROCEDURE H&P NOTE   Primary Care Physician: Alysia Penna, MD  HPI: Jade Beck is a 43 y.o. female who presents for Lower EUS for history of Rectal NET post resection in 2024.  Past Medical History:  Diagnosis Date   Anemia    pregnancy induced   Past Surgical History:  Procedure Laterality Date   ENDOSCOPIC MUCOSAL RESECTION N/A 09/14/2022   Procedure: ENDOSCOPIC MUCOSAL RESECTION;  Surgeon: Meridee Score Netty Starring., MD;  Location: [facility];  Service: Gastroenterology;  Laterality: N/A;   EUS N/A 09/14/2022   Procedure: LOWER ENDOSCOPIC ULTRASOUND (EUS);  Surgeon: Lemar Lofty., MD;  Location: [facility];  Service: Gastroenterology;  Laterality: N/A;   FLEXIBLE SIGMOIDOSCOPY N/A 09/14/2022   Procedure: FLEXIBLE SIGMOIDOSCOPY;  Surgeon: Meridee Score Netty Starring., MD;  Location: [facility];  Service: Gastroenterology;  Laterality: N/A;   HEMOSTASIS CLIP PLACEMENT  09/14/2022   Procedure: HEMOSTASIS CLIP PLACEMENT;  Surgeon: Meridee Score Netty Starring., MD;  Location: [facility];  Service: Gastroenterology;;   NASAL RECONSTRUCTION WITH SEPTAL REPAIR     SUBMUCOSAL LIFTING INJECTION  09/14/2022   Procedure: SUBMUCOSAL LIFTING INJECTION;  Surgeon: Lemar Lofty., MD;  Location: [facility];  Service: Gastroenterology;;   WISDOM TOOTH EXTRACTION     Current Facility-Administered Medications  Medication Dose Route Frequency Provider Last Rate Last Admin   0.9 %  sodium chloride infusion   Intravenous Continuous Mansouraty, Netty Starring., MD        Current Facility-Administered Medications:    0.9 %  sodium chloride infusion, , Intravenous, Continuous, Mansouraty, Netty Starring., MD No Known Allergies Family History  Problem Relation Age of Onset   Diabetes Father    Heart disease Brother    Social History   Socioeconomic History   Marital status: Married    Spouse name: Not on file   Number of children: 1   Years of education: Not on  file   Highest education level: Not on file  Occupational History   Occupation: self employed  Tobacco Use   Smoking status: Never   Smokeless tobacco: Never  Substance and Sexual Activity   Alcohol use: No   Drug use: No   Sexual activity: Yes  Other Topics Concern   Not on file  Social History Narrative   Not on file   Social Drivers of Health   Financial Resource Strain: Not on file  Food Insecurity: Not on file  Transportation Needs: Not on file  Physical Activity: Not on file  Stress: Not on file  Social Connections: Not on file  Intimate Partner Violence: Not on file    Physical Exam: Today's Vitals   11/18/23 0958  BP: 132/83  Pulse: (!) 105  Resp: 16  Temp: (!) 97.4 F (36.3 C)  TempSrc: Temporal  SpO2: 100%  Weight: 69.4 kg  Height: 5\' 9"  (1.753 m)  PainSc: 0-No pain   Body mass index is 22.59 kg/m. GEN: NAD EYE: Sclerae anicteric ENT: MMM CV: Non-tachycardic GI: Soft, NT/ND NEURO:  Alert & Oriented x 3  Lab Results: No results for input(s): "WBC", "HGB", "HCT", "PLT" in the last 72 hours. BMET No results for input(s): "NA", "K", "CL", "CO2", "GLUCOSE", "BUN", "CREATININE", "CALCIUM" in the last 72 hours. LFT No results for input(s): "PROT", "ALBUMIN", "AST", "ALT", "ALKPHOS", "BILITOT", "BILIDIR", "IBILI" in the last 72 hours. PT/INR No results for input(s): "LABPROT", "INR" in the last 72 hours.   Impression / Plan: This is a 43 y.o.female who presents for Lower EUS for history  of Rectal NET post resection in 2024.  The risks of an EUS including intestinal perforation, bleeding, infection, aspiration, and medication effects were discussed as was the possibility it may not give a definitive diagnosis if a biopsy is performed.  When a biopsy of the pancreas is done as part of the EUS, there is an additional risk of pancreatitis at the rate of about 1-2%.  It was explained that procedure related pancreatitis is typically mild, although it can be  severe and even life threatening, which is why we do not perform random pancreatic biopsies and only biopsy a lesion/area we feel is concerning enough to warrant the risk.  The risks and benefits of endoscopic evaluation/treatment were discussed with the patient and/or family; these include but are not limited to the risk of perforation, infection, bleeding, missed lesions, lack of diagnosis, severe illness requiring hospitalization, as well as anesthesia and sedation related illnesses.  The patient's history has been reviewed, patient examined, no change in status, and deemed stable for procedure.  The patient and/or family is agreeable to proceed.    Jade Parish, MD [facility] Office # [phone]

## 2023-11-18 NOTE — Transfer of Care (Signed)
Immediate Anesthesia Transfer of Care Note  Patient: Jade Beck  Procedure(s) Performed: LOWER ENDOSCOPIC ULTRASOUND (EUS) FLEXIBLE SIGMOIDOSCOPY  Patient Location: Endoscopy Unit  Anesthesia Type:MAC  Level of Consciousness: drowsy and patient cooperative  Airway & Oxygen Therapy: Patient Spontanous Breathing and Patient connected to nasal cannula oxygen  Post-op Assessment: Report given to RN and Post -op Vital signs reviewed and stable  Post vital signs: Reviewed and stable  Last Vitals:  Vitals Value Taken Time  BP 90/54 11/18/23 1127  Temp    Pulse    Resp 19 11/18/23 1128  SpO2    Vitals shown include unfiled device data.  Last Pain:  Vitals:   11/18/23 0958  TempSrc: Temporal  PainSc: 0-No pain         Complications: No notable events documented.

## 2023-11-18 NOTE — Anesthesia Postprocedure Evaluation (Addendum)
Anesthesia Post Note  Patient: Jade Beck  Procedure(s) Performed: LOWER ENDOSCOPIC ULTRASOUND (EUS) FLEXIBLE SIGMOIDOSCOPY     Patient location during evaluation: PACU Anesthesia Type: MAC Level of consciousness: awake Pain management: pain level controlled Vital Signs Assessment: post-procedure vital signs reviewed and stable Respiratory status: spontaneous breathing, nonlabored ventilation and respiratory function stable Cardiovascular status: stable and blood pressure returned to baseline Postop Assessment: no apparent nausea or vomiting Anesthetic complications: no Comments: Addendum: Called to see patient for left eye pain. Eye is watery and feels scratchy. It is red. Nurse has already flushed with saline. Patient reports that it feels better, but is still sore. Husband reports that it looks better as well. Discussed possible corneal abrasion and corneal abrasion order set ordered. Instructed patient to let us know if not improving after 24 hours.    No notable events documented.  Last Vitals:  Vitals:   11/18/23 0958 11/18/23 1127  BP: 132/83 (!) 90/54  Pulse: (!) 105 83  Resp: 16 19  Temp: (!) 36.3 C   SpO2: 100% 100%    Last Pain:  Vitals:   11/18/23 1127  TempSrc: Temporal  PainSc:                  Linton Rump

## 2023-11-21 ENCOUNTER — Encounter (HOSPITAL_COMMUNITY): Payer: Self-pay | Admitting: Gastroenterology

## 2023-11-22 ENCOUNTER — Other Ambulatory Visit: Payer: BC Managed Care – PPO

## 2023-11-22 DIAGNOSIS — Z0189 Encounter for other specified special examinations: Secondary | ICD-10-CM | POA: Diagnosis not present

## 2023-11-22 DIAGNOSIS — D3A026 Benign carcinoid tumor of the rectum: Secondary | ICD-10-CM

## 2023-11-22 DIAGNOSIS — Z0000 Encounter for general adult medical examination without abnormal findings: Secondary | ICD-10-CM | POA: Diagnosis not present

## 2023-11-22 DIAGNOSIS — Z1389 Encounter for screening for other disorder: Secondary | ICD-10-CM | POA: Diagnosis not present

## 2023-11-22 DIAGNOSIS — K5909 Other constipation: Secondary | ICD-10-CM | POA: Diagnosis not present

## 2023-11-22 DIAGNOSIS — R7989 Other specified abnormal findings of blood chemistry: Secondary | ICD-10-CM | POA: Diagnosis not present

## 2023-11-23 LAB — CHROMOGRANIN A: Chromogranin A (ng/mL): 53.9 ng/mL (ref 0.0–101.8)

## 2023-11-24 DIAGNOSIS — Z1331 Encounter for screening for depression: Secondary | ICD-10-CM | POA: Diagnosis not present

## 2023-11-24 DIAGNOSIS — Z0000 Encounter for general adult medical examination without abnormal findings: Secondary | ICD-10-CM | POA: Diagnosis not present

## 2023-11-24 DIAGNOSIS — Z1339 Encounter for screening examination for other mental health and behavioral disorders: Secondary | ICD-10-CM | POA: Diagnosis not present

## 2024-04-19 DIAGNOSIS — I340 Nonrheumatic mitral (valve) insufficiency: Secondary | ICD-10-CM | POA: Diagnosis not present

## 2024-04-19 DIAGNOSIS — I361 Nonrheumatic tricuspid (valve) insufficiency: Secondary | ICD-10-CM | POA: Diagnosis not present

## 2024-05-30 DIAGNOSIS — N898 Other specified noninflammatory disorders of vagina: Secondary | ICD-10-CM | POA: Diagnosis not present

## 2024-05-30 DIAGNOSIS — R1012 Left upper quadrant pain: Secondary | ICD-10-CM | POA: Diagnosis not present

## 2024-05-30 DIAGNOSIS — B3731 Acute candidiasis of vulva and vagina: Secondary | ICD-10-CM | POA: Diagnosis not present

## 2024-05-30 DIAGNOSIS — N644 Mastodynia: Secondary | ICD-10-CM | POA: Diagnosis not present

## 2024-06-02 DIAGNOSIS — R002 Palpitations: Secondary | ICD-10-CM | POA: Diagnosis not present

## 2024-06-02 DIAGNOSIS — R0600 Dyspnea, unspecified: Secondary | ICD-10-CM | POA: Diagnosis not present

## 2024-06-07 DIAGNOSIS — R1012 Left upper quadrant pain: Secondary | ICD-10-CM | POA: Diagnosis not present

## 2024-06-07 NOTE — Progress Notes (Signed)
 REFERRING PHYSICIAN:  Barbette Robbi SAUNDERS, MD PROVIDER:  RICHERD SILVERSMITH, MD MRN: [id number] DOB: Oct 06, 1980 DATE OF ENCOUNTER: 06/07/2024 Subjective    Reason for Referral: LUQ pain and lump when doing crunches History of Present Illness: Jade Beck is a 43 y.o. female who is seen today as an office consultation for evaluation of LUQ pain and lump when doing crunches. 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  Patient states she notices a painful lump that pops up when she is doing crunches and gets hard and painful and then subsides. No nausea or vomiting. Initially began hurting in pregnancy and she attributed it to the baby which was 7 years ago. She had an episode of pain and a lump when doing crunches shortly postpartum and then not again until the last year. Over the last year she had another episode while doing crunches and noticed a lump that quickly resolve after about 30 seconds. She had another bout of sharp pain when bending over in the last month or two.  No history of abdominal surgeries. On CT from 2023 she has no evidence of hernia on imaging.    Review of Systems: A complete review of systems was obtained from the patient.  I have reviewed this information and discussed as appropriate with the patient.  ROS otherwise negative except as noted in HPI.   Medical History: Past Medical History:  Diagnosis Date  . Anemia   . History of cancer     There is no problem list on file for this patient.   Past Surgical History:  Procedure Laterality Date  . Hemostasis clip placement   09/14/2022  . FLEXIBLE SIGMOIDOSCOPY  11/18/2023  . Nasal reconstruction with septal repair       No Known Allergies  Current Outpatient Medications on File Prior to Visit  Medication Sig Dispense Refill  . ascorbic acid, vitamin C, (VITAMIN C) 500 MG tablet Take 500 mg by mouth once daily    . cholecalciferol (VITAMIN D3) 1000 unit tablet Take 1,000 Units by  mouth once daily    . cyanocobalamin (VITAMIN B12) 1000 MCG tablet Take 1,000 mcg by mouth once daily    . Lactobacillus no.46/B.animalis (PROBIOTIC-10 ORAL) Take by mouth    . zinc gluconate 50 mg tablet Take 50 mg by mouth once daily     No current facility-administered medications on file prior to visit.    Family History  Problem Relation Age of Onset  . Diabetes Father   . Heart valve disease Brother      Social History   Tobacco Use  Smoking Status Never  Smokeless Tobacco Never     Social History   Socioeconomic History  . Marital status: Married  Tobacco Use  . Smoking status: Never  . Smokeless tobacco: Never  Vaping Use  . Vaping status: Never Used  Substance and Sexual Activity  . Alcohol  use: Never  . Drug use: Never   Social Drivers of Health   Housing Stability: Unknown (06/07/2024)   Housing Stability Vital Sign   . Homeless in the Last Year: No    Objective:   Vitals:   06/07/24 1435 06/07/24 1437  BP: 130/76   Pulse: 97   Temp: 36.9 C (98.5 F)   SpO2: 99%   Weight: 69.9 kg (154 lb)   Height: 175.3 cm (5' 9)   PainSc:    2  PainLoc:  Abdomen    Body mass index is  22.74 kg/m.  Physical Exam: General: No acute distress, well appearing HEENT: PERRL, hearing grossly normal, mucous membranes moist CV: Regular rate and rhythm Pulm: Normal work of breathing on room air Abd: Soft, nontender, nondistended. No evidence of hernia with valsalva or with changing positions/bending over Extremities: Warm and well perfused Neuro: A&O x4, no focal neurologic deficits Psych: Appropriate mood and effect  Labs, Imaging and Diagnostic Testing: I have personally reviewed all imaging and agree with radiologist's interpretation. CT AP 09/02/22: No evidence of hernia seen. I have personally reviewed any pertinent labs. I have personally reviewed OBGYN referral notes, Dr. Barbette from 05/30/24. Reviewed GI notes including EUS from 2025 and colonoscopy from  2023.  Assessment and Plan:     Diagnoses and all orders for this visit:  Left upper quadrant abdominal pain -     CT abdomen pelvis with contrast; Future     - Will order CT AP to evaluate for hernia. If no evidence of hernia then will plan on obtaining US  abdomen for more dynamic imaging. - Will plan for follow up after imaging has resulted.      I spent a total of 46 minutes in both face-to-face and non-face-to-face activities, excluding procedures performed, for this visit on the date of this encounter.  Orie Silversmith, MD [facility] Surgery

## 2024-06-08 ENCOUNTER — Other Ambulatory Visit: Payer: Self-pay | Admitting: General Surgery

## 2024-06-08 DIAGNOSIS — R1012 Left upper quadrant pain: Secondary | ICD-10-CM

## 2024-06-12 ENCOUNTER — Encounter: Payer: Self-pay | Admitting: Radiology

## 2024-06-12 ENCOUNTER — Ambulatory Visit
Admission: RE | Admit: 2024-06-12 | Discharge: 2024-06-12 | Disposition: A | Discharge status: Home / Self Care | Source: Ambulatory Visit | Attending: General Surgery | Admitting: General Surgery

## 2024-06-12 DIAGNOSIS — R1012 Left upper quadrant pain: Secondary | ICD-10-CM

## 2024-06-12 DIAGNOSIS — R109 Unspecified abdominal pain: Secondary | ICD-10-CM | POA: Diagnosis not present

## 2024-06-12 MED ORDER — IOPAMIDOL (ISOVUE-300) INJECTION 61%
100.0000 mL | Freq: Once | INTRAVENOUS | Status: AC | PRN
  Administered 2024-06-12: 100 mL via INTRAVENOUS

## 2024-06-13 ENCOUNTER — Other Ambulatory Visit: Payer: Self-pay | Admitting: General Surgery

## 2024-06-13 DIAGNOSIS — R1012 Left upper quadrant pain: Secondary | ICD-10-CM

## 2024-06-14 ENCOUNTER — Encounter: Payer: Self-pay | Admitting: General Surgery

## 2024-06-15 ENCOUNTER — Ambulatory Visit
Admission: RE | Admit: 2024-06-15 | Discharge: 2024-06-15 | Disposition: A | Discharge status: Home / Self Care | Source: Ambulatory Visit | Attending: General Surgery | Admitting: General Surgery

## 2024-06-15 DIAGNOSIS — R1012 Left upper quadrant pain: Secondary | ICD-10-CM | POA: Diagnosis not present

## 2024-08-03 DIAGNOSIS — Z1231 Encounter for screening mammogram for malignant neoplasm of breast: Secondary | ICD-10-CM | POA: Diagnosis not present

## 2024-08-14 DIAGNOSIS — N390 Urinary tract infection, site not specified: Secondary | ICD-10-CM | POA: Diagnosis not present

## 2024-10-25 ENCOUNTER — Emergency Department (HOSPITAL_BASED_OUTPATIENT_CLINIC_OR_DEPARTMENT_OTHER)
Admission: EM | Admit: 2024-10-25 | Discharge: 2024-10-25 | Disposition: A | Discharge status: Home / Self Care | Source: Ambulatory Visit | Attending: Emergency Medicine | Admitting: Emergency Medicine

## 2024-10-25 ENCOUNTER — Other Ambulatory Visit: Payer: Self-pay

## 2024-10-25 ENCOUNTER — Emergency Department (HOSPITAL_BASED_OUTPATIENT_CLINIC_OR_DEPARTMENT_OTHER): Admitting: Radiology

## 2024-10-25 DIAGNOSIS — R0789 Other chest pain: Secondary | ICD-10-CM | POA: Insufficient documentation

## 2024-10-25 DIAGNOSIS — R42 Dizziness and giddiness: Secondary | ICD-10-CM | POA: Diagnosis not present

## 2024-10-25 LAB — CBC
HCT: 37 % (ref 36.0–46.0)
Hemoglobin: 12.5 g/dL (ref 12.0–15.0)
MCH: 30 pg (ref 26.0–34.0)
MCHC: 33.8 g/dL (ref 30.0–36.0)
MCV: 88.7 fL (ref 80.0–100.0)
Platelets: 228 10*3/uL (ref 150–400)
RBC: 4.17 MIL/uL (ref 3.87–5.11)
RDW: 13.2 % (ref 11.5–15.5)
WBC: 8.7 10*3/uL (ref 4.0–10.5)
nRBC: 0 % (ref 0.0–0.2)

## 2024-10-25 LAB — BASIC METABOLIC PANEL WITH GFR
Anion gap: 12 (ref 5–15)
BUN: 24 mg/dL — ABNORMAL HIGH (ref 6–20)
CO2: 23 mmol/L (ref 22–32)
Calcium: 9.7 mg/dL (ref 8.9–10.3)
Chloride: 104 mmol/L (ref 98–111)
Creatinine, Ser: 0.78 mg/dL (ref 0.44–1.00)
GFR, Estimated: >60 mL/min
Glucose, Bld: 92 mg/dL (ref 70–99)
Potassium: 4.1 mmol/L (ref 3.5–5.1)
Sodium: 139 mmol/L (ref 135–145)

## 2024-10-25 LAB — TROPONIN T, HIGH SENSITIVITY: Troponin T High Sensitivity: <6 ng/L (ref 0–19)

## 2024-10-25 NOTE — Discharge Instructions (Signed)
 It was a pleasure meeting with you today. As we discussed your vitals, EKG, blood work including troponin, and chest x-ray were normal with no acute findings.   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 I do not feel this is the cause of today's chest pain based on your stable presentation and vitals. Pain today is most likely related to previous precordial chest pain. I do recommend reaching out to your cardiologist to discuss today's symptoms and work up received in the ED as they may want to see you for follow-up sooner than the normal annual May timeframe.  Return to ED if symptoms return or new concerning symptoms develop like dizziness, loss of consciousness, shortness of breath, confusion, etc.

## 2024-10-25 NOTE — ED Notes (Signed)
 Pt d/c instructions, medications, and follow-up care reviewed with pt. Pt verbalized understanding and had no further questions at time of d/c. Pt CA&Ox4, ambulatory, and in NAD at time of d/c. Pt discharged with family.

## 2024-10-25 NOTE — ED Triage Notes (Signed)
 Reports sharp central CP that is worse when taking deep breath. Felt pop under left breath.

## 2024-10-25 NOTE — ED Provider Notes (Signed)
 " [place] EMERGENCY DEPARTMENT AT [facility] Provider Note   CSN: [id number] Arrival date & time: 10/25/24  1203     Patient presents with: Chest Pain   Jade Beck is a 44 y.o. female.  With pertinent medical history of thoracic ascending aortic aneurysm and anemia.   Patient is here for evaluation of sudden onset left chest pain that occurred earlier today while she was mopping.  Patient is accompanied by her husband and son today.  She states mopping is normal for her and nonexertional.  She did move a bunch of chunks of ice from a trampoline yesterday which she states was exertional.  Left chest pain was localized below the left breast and described as a needlepoint sharpness to this region with slight radiation to her left anterior chest.  She felt as though she could not catch her breath for about 10 minutes before the pain began to subside.  Before the pain began to subside she felt as though there was a pop sensation within her left chest just under her breast.  She has had pain like this before however it only lasted about 3 to 4 breaths before subsiding, so today's episode was abnormal.  In the past she was told by her cardiologist the pain was due to a precordial catch.  She experiences these a few times a year.  Again today was different due to the duration and severity of pain.  Denies nausea, vomiting, or syncope.  She does report a bit of dizziness during the episode of chest pain and shortness of breath.  She has had a dry cough which she attributes to allergies over the last 2 weeks.  Denies congestion or fevers. Denies unilateral leg swelling or pain.   She does follow with cardiology annually in the May timeframe.  -Per internal medicine note 11/24/2023: Patient receives a TTE every other year, previous was March 2023.  Sees cardiologist annually, Dr. Lucas J[facility]ac at [facility].  She has a history of thoracic ascending aortic aneurysm.  She has a sibling  with an AV replacement and ascending thoracic aortic dilation. - We do not have access to her cardiology notes within [facility].  The history is provided by the patient and the spouse.  Chest Pain Pain location:  L chest Pain quality: sharp and stabbing       Prior to Admission medications  Medication Sig Start Date End Date Taking? Authorizing Provider  ascorbic acid (VITAMIN C) 500 MG tablet Take 500 mg by mouth daily.    [provider]  cholecalciferol (VITAMIN D3) 25 MCG (1000 UNIT) tablet Take 1,000 Units by mouth daily.    [provider]  cyanocobalamin (VITAMIN B12) 1000 MCG tablet Take 1,000 mcg by mouth daily.    [provider]  magnesium  oxide (MAG-OX) 400 (240 Mg) MG tablet Take 400 mg by mouth at bedtime.    [provider]  Omega-3 Fatty Acids (FISH OIL) 1000 MG CAPS Take 1,000 mg by mouth daily.    [provider]  zinc gluconate 50 MG tablet Take 50 mg by mouth daily.    [provider]    Allergies: Patient has no known allergies.    Review of Systems  Cardiovascular:  Positive for chest pain.   Vitals:   10/25/24 1216 10/25/24 1230 10/25/24 1245 10/25/24 1435  BP:  111/67 125/89 (!) 114/57  Pulse: 93 98 90 92  Resp: 10 16 17 20   Temp: 98.3 F (  36.8 C)     SpO2: 100% 100% 100% 100%    Updated Vital Signs BP (!) 114/57   Pulse 92   Temp 98.3 F (36.8 C)   Resp 20   SpO2 100%   Physical Exam Vitals and nursing note reviewed.  Constitutional:      General: She is not in acute distress.    Appearance: Normal appearance. She is well-developed and normal weight. She is not ill-appearing, toxic-appearing or diaphoretic.  HENT:     Head: Normocephalic and atraumatic.     Nose: Nose normal.     Mouth/Throat:     Mouth: Mucous membranes are moist.  Eyes:     General: No scleral icterus.    Extraocular Movements: Extraocular movements intact.     Conjunctiva/sclera: Conjunctivae normal.      Pupils: Pupils are equal, round, and reactive to light.  Cardiovascular:     Rate and Rhythm: Normal rate and regular rhythm.     Pulses: Normal pulses.     Heart sounds: Normal heart sounds. No murmur heard. Pulmonary:     Effort: Pulmonary effort is normal. No tachypnea, accessory muscle usage or respiratory distress.     Breath sounds: Normal breath sounds. No stridor. No decreased breath sounds, wheezing, rhonchi or rales.  Chest:     Chest wall: No deformity, tenderness, crepitus or edema.     Comments: No reproducible pain with palpation of chest wall Abdominal:     General: Abdomen is flat. Bowel sounds are normal. There is no distension.     Palpations: Abdomen is soft. There is no mass.     Tenderness: There is no abdominal tenderness. There is no guarding.  Musculoskeletal:        General: Normal range of motion.     Cervical back: Normal range of motion.     Right lower leg: No tenderness. No edema.     Left lower leg: No tenderness. No edema.  Skin:    General: Skin is warm and dry.     Capillary Refill: Capillary refill takes less than 2 seconds.     Coloration: Skin is not cyanotic, jaundiced or pale.     Findings: No erythema or rash.  Neurological:     Mental Status: She is alert and oriented to person, place, and time.     (all labs ordered are listed, but only abnormal results are displayed) Labs Reviewed  BASIC METABOLIC PANEL WITH GFR - Abnormal; Notable for the following components:      Result Value   BUN 24 (*)    All other components within normal limits  CBC  TROPONIN T, HIGH SENSITIVITY  TROPONIN T, HIGH SENSITIVITY    EKG: EKG Interpretation Date/Time:  Wednesday October 25 2024 12:14:24 EST Ventricular Rate:  100 PR Interval:  141 QRS Duration:  84 QT Interval:  349 QTC Calculation: 451 R Axis:   63  Text Interpretation: Sinus tachycardia Nonspecific T abnormalities, lateral leads No old tracing to compare Confirmed by Dean Clarity  [phone]) on 10/25/2024 12:22:08 PM  Radiology: DG Chest 2 View Result Date: 10/25/2024 CLINICAL DATA:  Chest pain. EXAM: CHEST - 2 VIEW COMPARISON:  None Available. FINDINGS: The heart size and mediastinal contours are within normal limits. Both lungs are clear. The visualized skeletal structures are unremarkable. IMPRESSION: No active cardiopulmonary disease. Electronically Signed   By: Vanetta Chou M.D.   On: 10/25/2024 13:44     Procedures   Medications Ordered in the  ED - No data to display   Patient presents to the ED for concern of chest pain, this involves an extensive number of treatment options, and is a complaint that carries with it a high risk of complications and morbidity.  The differential diagnosis includes ACS, PE, aortic dissection, MSK, viral illness, GERD, pneumonia, pneumothorax, anxiety, shingles, arrhythmia, precordial pain.   Co morbidities that complicate the patient evaluation  History of thoracic ascending aortic aneurysm, follows with cardiology annually and receives TTE every other year.   Additional history obtained:  Additional history obtained from  Family and Outside Medical Records   External records from outside source obtained and reviewed including additional history provided by husband at bedside and review of internal medicine notes for additional history.   Lab Tests:  I Ordered, and personally interpreted labs.  The pertinent results include:  Unremarkable CBC and BMP. Initial troponin is normal, <6.   Imaging Studies ordered:  I ordered imaging studies including chest x-ray  I independently visualized and interpreted imaging which showed no acute cardiopulmonary findings. I agree with the radiologist interpretation   Cardiac Monitoring:  The patient was maintained on a cardiac monitor.  I personally viewed and interpreted the cardiac monitored which showed an underlying rhythm of: Sinus tachycardia with no evidence of  STEMI.   Problem List / ED Course:     Atypical chest pain.  History is suspicious for MSK or precordial pain. History not suspicious for GERD or viral illness. Physical exam with no findings suspicious for shingles, viral illness, and no reproducible pain on palpation. Imaging with evidence of pneumonia or pneumothorax. ECG and Troponin normal lowering suspicion for ACS. There is consideration for aortic dissection as patient does have a known history of thoracic ascending aortic aneurysm. She follows annually with cardiology and undergoes TTE every other year for monitoring with most recent TTE performed in 2025. Chest x-ray shows no tracheal deviation, widened mediastinum, or enlarged aortic knob. Vitals are reassuring.  Patient offered additional work-up including, CT angiogram aortic dissection study to further investigate for aortic dissection. Also discussed the low suspicion for aortic dissection at this time based on patient presentation and workup thus far. Shared decision making, patient does not wish to have CT angiogram at this time. Symptoms are likely precordial pain, as she has experienced this in the past and symptoms are similar. Encouraged patient to reach out to her established cardiologist for ongoing evaluation and management. Strict return precautions discussed with patient and her husband and both verbalized understanding. Stable for discharge.   Reevaluation:  After the interventions noted above, I reevaluated the patient and found that they have :improved   Dispostion:  After consideration of the diagnostic results and the patients response to treatment, I feel that the patent would benefit from supportive care in the home setting with continued monitoring of symptoms and close follow-up with established cardiologist. Return precautions given.               HEART Score: 2                  PERC Score: 0, PERC Score Interpretation: No need for further workup, as <2%  chance of PE.  If no criteria are positive and clinicians pre-test probability is <15%, PERC Rule criteria are satisfied Medical Decision Making Amount and/or Complexity of Data Reviewed Labs: ordered. Radiology: ordered.   This note was produced using [employer]. While the provider has reviewed and verified all clinical  information, transcription errors may remain.    Final diagnoses:  Atypical chest pain    ED Discharge Orders     None          Jade Beck 10/25/24 2300  "
